# Patient Record
Sex: Female | Born: 1960 | Race: White | Hispanic: No | Marital: Married
Health system: Northeastern US, Academic
[De-identification: ages and names within clinical notes are randomized; demographics above are authoritative.]

---

## 2019-11-30 MED ORDER — DOXYCYCLINE HYCLATE 100 MG TABLET
100 | ORAL_TABLET | ORAL | 1 refills | 7.00000 days | Status: AC
Start: 2019-11-30 — End: 2019-12-07

## 2019-11-30 MED ORDER — FLUTICASONE 250 MCG-SALMETEROL 50 MCG/DOSE BLISTR POWDR FOR INHALATION
250-50 | RESPIRATORY_TRACT | 1 refills | 30.00000 days | Status: AC
Start: 2019-11-30 — End: 2019-12-28

## 2019-12-10 MED ORDER — BENZONATATE 200 MG CAPSULE
200 | ORAL_CAPSULE | ORAL | 1 refills | 7.00000 days | Status: AC
Start: 2019-12-10 — End: 2019-12-21

## 2019-12-10 MED ORDER — PREDNISONE 20 MG TABLET
20 | ORAL_TABLET | ORAL | 1 refills | 12.00000 days | Status: AC
Start: 2019-12-10 — End: 2019-12-21

## 2019-12-10 MED ORDER — ALBUTEROL SULFATE HFA 90 MCG/ACTUATION AEROSOL INHALER
90 | Freq: Four times a day (QID) | RESPIRATORY_TRACT | 2.00 refills | 25.00000 days | Status: AC | PRN
Start: 2019-12-10 — End: 2023-01-10

## 2019-12-28 MED ORDER — FLUTICASONE 250 MCG-SALMETEROL 50 MCG/DOSE BLISTR POWDR FOR INHALATION
250-50 | RESPIRATORY_TRACT | 1 refills | 30.00000 days | Status: AC
Start: 2019-12-28 — End: 2023-01-10

## 2019-12-31 ENCOUNTER — Telehealth: Admit: 2019-12-31 | Payer: PRIVATE HEALTH INSURANCE | Primary: Family Medicine

## 2019-12-31 NOTE — Telephone Encounter
Left message for patient regarding new patient visit. Notified them that if they expect any allergy testing to refrain from any antihistamines until after their appt. Pt asked to call back with questions or if unable to make the appt.

## 2020-01-01 ENCOUNTER — Encounter: Admit: 2020-01-01 | Payer: PRIVATE HEALTH INSURANCE | Attending: Hematology and Oncology | Primary: Family Medicine

## 2020-01-01 DIAGNOSIS — C20 Malignant neoplasm of rectum: Secondary | ICD-10-CM

## 2020-01-04 ENCOUNTER — Ambulatory Visit: Admit: 2020-01-04 | Payer: PRIVATE HEALTH INSURANCE | Attending: Allergy and Immunology | Primary: Family Medicine

## 2020-01-04 ENCOUNTER — Encounter: Admit: 2020-01-04 | Payer: PRIVATE HEALTH INSURANCE | Attending: Allergy and Immunology | Primary: Family Medicine

## 2020-01-04 DIAGNOSIS — C20 Malignant neoplasm of rectum: Secondary | ICD-10-CM

## 2020-01-04 DIAGNOSIS — R922 Inconclusive mammogram: Secondary | ICD-10-CM

## 2020-01-04 DIAGNOSIS — R053 Chronic cough: Secondary | ICD-10-CM

## 2020-01-04 DIAGNOSIS — N921 Excessive and frequent menstruation with irregular cycle: Secondary | ICD-10-CM

## 2020-01-04 DIAGNOSIS — M1990 Unspecified osteoarthritis, unspecified site: Secondary | ICD-10-CM

## 2020-01-04 DIAGNOSIS — J309 Allergic rhinitis, unspecified: Secondary | ICD-10-CM

## 2020-01-04 DIAGNOSIS — G43909 Migraine, unspecified, not intractable, without status migrainosus: Secondary | ICD-10-CM

## 2020-01-04 DIAGNOSIS — D219 Benign neoplasm of connective and other soft tissue, unspecified: Secondary | ICD-10-CM

## 2020-01-04 DIAGNOSIS — R05 Cough: Secondary | ICD-10-CM

## 2020-01-04 MED ORDER — MONTELUKAST 10 MG TABLET
10 mg | ORAL_TABLET | Freq: Every day | ORAL | 4 refills | Status: AC
Start: 2020-01-04 — End: 2020-04-11

## 2020-01-05 DIAGNOSIS — J45909 Unspecified asthma, uncomplicated: Secondary | ICD-10-CM

## 2020-01-07 ENCOUNTER — Telehealth
Admit: 2020-01-07 | Payer: PRIVATE HEALTH INSURANCE | Attending: Female Pelvic Medicine and Reconstructive Surgery | Primary: Family Medicine

## 2020-01-07 NOTE — Telephone Encounter
LVMM

## 2020-01-07 NOTE — Telephone Encounter
Called patient for pre-visit screening:Patient denies testing positive for COVID-19 within the last 2 weeks.Patient denies flu-like symptoms such as fever >100 degrees F, new cough, new shortness of breath, new sore throat, or sudden loss of taste/smell.Patient denies contact with a person known or suspected to have coronavirus.Patient denies living in SNF, STR, or ECF.Patient denies recent travel outside of [place] in the past 14 days.Patient understands there is a visitor restriction.

## 2020-01-08 ENCOUNTER — Inpatient Hospital Stay: Admit: 2020-01-08 | Discharge: 2020-01-08 | Payer: PRIVATE HEALTH INSURANCE | Primary: Family Medicine

## 2020-01-08 ENCOUNTER — Ambulatory Visit: Admit: 2020-01-08 | Payer: PRIVATE HEALTH INSURANCE | Attending: Hematology & Oncology | Primary: Family Medicine

## 2020-01-08 ENCOUNTER — Ambulatory Visit: Admit: 2020-01-08 | Payer: PRIVATE HEALTH INSURANCE | Primary: Family Medicine

## 2020-01-08 DIAGNOSIS — C20 Malignant neoplasm of rectum: Secondary | ICD-10-CM

## 2020-01-08 DIAGNOSIS — J309 Allergic rhinitis, unspecified: Secondary | ICD-10-CM

## 2020-01-08 LAB — COMPREHENSIVE METABOLIC PANEL
BKR A/G RATIO: 1.4 (ref 1.0–2.2)
BKR ALANINE AMINOTRANSFERASE (ALT): 18 U/L (ref 10–35)
BKR ALBUMIN: 4.2 g/dL (ref 3.6–4.9)
BKR ALKALINE PHOSPHATASE: 93 U/L (ref 9–122)
BKR ANION GAP: 10 (ref 7–17)
BKR ASPARTATE AMINOTRANSFERASE (AST): 23 U/L (ref 10–35)
BKR AST/ALT RATIO: 1.3 (ref 0.3–4.9)
BKR BILIRUBIN TOTAL: <0.2 mg/dL (ref <=1.2)
BKR BLOOD UREA NITROGEN: 12 mg/dL (ref 6–20)
BKR BUN / CREAT RATIO: 16.9 (ref 8.0–23.0)
BKR CALCIUM: 9.2 mg/dL (ref 8.8–10.2)
BKR CHLORIDE: 102 mmol/L (ref 98–107)
BKR CO2: 26 mmol/L (ref 20–30)
BKR CREATININE: 0.71 mg/dL (ref 0.40–1.30)
BKR EGFR (AFR AMER): >60 mL/min/{1.73_m2} (ref >60)
BKR EGFR (NON AFRICAN AMERICAN): >60 mL/min/{1.73_m2} (ref >60)
BKR GLOBULIN: 2.9 g/dL (ref 2.3–3.5)
BKR GLUCOSE: 94 mg/dL (ref 70–100)
BKR POTASSIUM: 4 mmol/L (ref 3.3–5.1)
BKR PROTEIN TOTAL: 7.1 g/dL (ref 6.6–8.7)
BKR SODIUM: 138 mmol/L (ref 136–144)

## 2020-01-08 LAB — CBC WITH AUTO DIFFERENTIAL
BKR WAM ABSOLUTE IMMATURE GRANULOCYTES: 0 x 1000/ÂµL (ref 0.0–0.3)
BKR WAM ABSOLUTE LYMPHOCYTE COUNT: 1.5 x 1000/ÂµL (ref 1.0–4.0)
BKR WAM ABSOLUTE NRBC: 0 x 1000/ÂµL (ref 0.0–0.0)
BKR WAM ANALYZER ANC: 3 x 1000/ÂµL (ref 1.0–11.0)
BKR WAM BASOPHIL ABSOLUTE COUNT: 0.1 x 1000/ÂµL — ABNORMAL HIGH (ref 0.0–0.0)
BKR WAM BASOPHILS: 1 % (ref 0.0–4.0)
BKR WAM EOSINOPHIL ABSOLUTE COUNT: 0.1 x 1000/ÂµL (ref 0.0–1.0)
BKR WAM EOSINOPHILS: 2.6 % (ref 0.0–7.0)
BKR WAM HEMATOCRIT: 39.3 % (ref 37.0–52.0)
BKR WAM HEMOGLOBIN: 12.6 g/dL (ref 12.0–18.0)
BKR WAM IMMATURE GRANULOCYTES: 0.2 % (ref 0.0–3.0)
BKR WAM LYMPHOCYTES: 29.4 % (ref 8.0–49.0)
BKR WAM MCH (PG): 29 pg (ref 27.0–31.0)
BKR WAM MCHC: 32.1 g/dL (ref 31.0–36.0)
BKR WAM MCV: 90.6 fL (ref 78.0–94.0)
BKR WAM MONOCYTE ABSOLUTE COUNT: 0.3 x 1000/ÂµL (ref 0.0–2.0)
BKR WAM MONOCYTES: 6.5 % (ref 4.0–15.0)
BKR WAM MPV: 9.3 fL (ref 6.0–11.0)
BKR WAM NEUTROPHILS: 60.3 % (ref 37.0–84.0)
BKR WAM NUCLEATED RED BLOOD CELLS: 0 % (ref 0.0–1.0)
BKR WAM PLATELETS: 326 x1000/ÂµL (ref 140–440)
BKR WAM RDW-CV: 13.5 % (ref 11.5–14.5)
BKR WAM RED BLOOD CELL COUNT: 4.3 M/ÂµL (ref 3.8–5.9)
BKR WAM WHITE BLOOD CELL COUNT: 4.9 x1000/ÂµL (ref 4.0–10.0)

## 2020-01-08 LAB — CEA: BKR CARCINOEMBRYONIC ANTIGEN (YH ROCHE): <1.8 ng/mL
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
## 2020-01-11 LAB — ALLERGEN, RESPIRATORY PANEL (BH GH LMW YH)
BKR ALLERGEN ALTERNARIA ALTERNARA (TENUIS) IGE CONC: <0.1 kU/L (ref <0.10)
BKR ALLERGEN ASPERGILLUS FUMIGATUS IGE CONC: <0.1 kU/L (ref <0.10)
BKR ALLERGEN BERMUDA GRASS IGE CONC: <0.1 kU/L (ref <0.10)
BKR ALLERGEN CAT DANDER IGE CONC: 0.16 kU/L — ABNORMAL HIGH (ref <0.10)
BKR ALLERGEN CEDAR MOUNTAIN IGE CONC: <0.1 kU/L (ref <0.10)
BKR ALLERGEN CLADOSPORIUM HERBARUM IGE CONC: <0.1 kU/L (ref <0.10)
BKR ALLERGEN COMMON (SHORT) RAGWEED IGE CONC: 0.28 kU/L — ABNORMAL HIGH (ref <0.10)
BKR ALLERGEN COTTONWOOD IGE CONC: <0.1 kU/L (ref <0.10)
BKR ALLERGEN D. FARINAE IGE CONC: 1.87 kU/L — ABNORMAL HIGH (ref <0.10)
BKR ALLERGEN D. PTERONYSSINUS IGE CONC: 2.06 kU/L — ABNORMAL HIGH (ref <0.10)
BKR ALLERGEN DOG DANDER IGE CONC: <0.1 kU/L (ref <0.10)
BKR ALLERGEN ELM IGE CONC: <0.1 kU/L (ref <0.10)
BKR ALLERGEN GERMAN COCKROACH IGE CONC: <0.1 kU/L (ref <0.10)
BKR ALLERGEN MAPLE (BOX ELDER) IGE CONC: <0.1 kU/L (ref <0.10)
BKR ALLERGEN MUGWORT IGE CONC: <0.1 kU/L (ref <0.10)
BKR ALLERGEN OAK IGE CONC: 1.01 kU/L — ABNORMAL HIGH (ref <0.10)
BKR ALLERGEN PENICILLIUM CHRYSOGENUM/NOTATUM IGE CONC: <0.1 kU/L (ref <0.10)
BKR ALLERGEN ROUGH PIGWEED IGE CONC: <0.1 kU/L (ref <0.10)
BKR ALLERGEN SHEEP SORREL IGE CONC: <0.1 kU/L (ref <0.10)
BKR ALLERGEN SILVER BIRCH IGE CONC: 3.56 kU/L — ABNORMAL HIGH (ref <0.10)
BKR ALLERGEN SYCAMORE IGE CONC: <0.1 kU/L (ref <0.10)
BKR ALLERGEN TIMOTHY GRASS IGE CONC: <0.1 kU/L (ref <0.10)
BKR ALLERGEN WALNUT TREE IGE CONC: <0.1 kU/L (ref <0.10)
BKR ALLERGEN WHITE ASH IGE CONC: <0.1 kU/L (ref <0.10)
BKR ALLERGEN WHITE MULBERRY IGE CONC: <0.1 kU/L (ref <0.10)
BKR ALLERGEN, MOUSE URINE PROTEINS IGE CONC: <0.1 kU/L (ref <0.10)
BKR IGE TOTAL: 19 kU/L (ref <=214)
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
## 2020-01-21 ENCOUNTER — Encounter: Admit: 2020-01-21 | Payer: PRIVATE HEALTH INSURANCE | Primary: Family Medicine

## 2020-01-21 DIAGNOSIS — J45909 Unspecified asthma, uncomplicated: Secondary | ICD-10-CM

## 2020-01-22 ENCOUNTER — Telehealth: Admit: 2020-01-22 | Payer: PRIVATE HEALTH INSURANCE | Attending: Allergy and Immunology | Primary: Family Medicine

## 2020-01-22 NOTE — Telephone Encounter
Pt. Called to inform she needs to have orders resent to the lab as the previous one was incorrect.  Please call her she is unable to make an appt. Unless they have been resent correctly.

## 2020-01-24 ENCOUNTER — Encounter: Admit: 2020-01-24 | Payer: PRIVATE HEALTH INSURANCE | Attending: Allergy and Immunology | Primary: Family Medicine

## 2020-01-28 NOTE — Other
Testing showed possible triggers of Dust mites and some tree pollens. Cat dander and Ragweed results are equivocal.

## 2020-01-31 ENCOUNTER — Ambulatory Visit: Admit: 2020-01-31 | Payer: PRIVATE HEALTH INSURANCE | Primary: Family Medicine

## 2020-01-31 DIAGNOSIS — Z23 Encounter for immunization: Secondary | ICD-10-CM

## 2020-02-04 ENCOUNTER — Encounter: Admit: 2020-02-04 | Payer: PRIVATE HEALTH INSURANCE | Primary: Family Medicine

## 2020-02-04 DIAGNOSIS — Z01818 Encounter for other preprocedural examination: Secondary | ICD-10-CM

## 2020-02-05 ENCOUNTER — Inpatient Hospital Stay: Admit: 2020-02-05 | Discharge: 2020-02-05 | Payer: PRIVATE HEALTH INSURANCE | Primary: Family Medicine

## 2020-02-05 DIAGNOSIS — Z01818 Encounter for other preprocedural examination: Secondary | ICD-10-CM

## 2020-02-05 DIAGNOSIS — Z20822 Contact with and (suspected) exposure to covid-19: Secondary | ICD-10-CM

## 2020-02-06 DIAGNOSIS — Z01812 Encounter for preprocedural laboratory examination: Secondary | ICD-10-CM

## 2020-02-06 LAB — COVID-19 CLEARANCE OR FOR PLACEMENT ONLY: BKR SARS-COV-2 RNA (COVID-19) (YH): NOT DETECTED

## 2020-02-08 ENCOUNTER — Inpatient Hospital Stay: Admit: 2020-02-08 | Discharge: 2020-02-08 | Payer: PRIVATE HEALTH INSURANCE | Primary: Family Medicine

## 2020-02-08 DIAGNOSIS — J45909 Unspecified asthma, uncomplicated: Secondary | ICD-10-CM

## 2020-02-08 MED ORDER — ALBUTEROL SULFATE 2.5 MG/3 ML (0.083 %) SOLUTION FOR NEBULIZATION
2.5 mg /3 mL (0.083 %) | Freq: Once | RESPIRATORY_TRACT | Status: CP
Start: 2020-02-08 — End: ?
  Administered 2020-02-08: 16:00:00 2.5 mL via RESPIRATORY_TRACT

## 2020-02-21 ENCOUNTER — Ambulatory Visit: Admit: 2020-02-21 | Payer: PRIVATE HEALTH INSURANCE | Primary: Family Medicine

## 2020-02-21 DIAGNOSIS — Z23 Encounter for immunization: Secondary | ICD-10-CM

## 2020-03-15 MED ORDER — NORTRIPTYLINE 10 MG CAPSULE
10 | ORAL_CAPSULE | ORAL | 1 refills | 90.00000 days | Status: AC
Start: 2020-03-15 — End: 2020-04-11

## 2020-03-22 ENCOUNTER — Telehealth
Admit: 2020-03-22 | Payer: PRIVATE HEALTH INSURANCE | Attending: Student in an Organized Health Care Education/Training Program | Primary: Family Medicine

## 2020-03-22 NOTE — Telephone Encounter
I called the patient back, she called our nurses who passed me a message:Patient is having severe allergy reaction with swelling eyes. Patient has been taking allegra q 6 hrs and Pataday frequently. She has appointment with MD next week . However, she can not wait for that time. Please call patient: [phone] states for the last day the allergies have been worse. Asthma has been improved.Eyes swollen. Itching all over her face/ears. No specific allergy exposure. Has been taking allerga every 6 hours. Today has taken 2 today so far. We can increase her antihistamine treatment - She can take Zyrtec BID (max 2 pills AM and 2 pills in PM), and change to Pataday 0.7% eye drops daily. Discussed that hopefully this increased regimen will help. She can try this for two days. If her allergies are worsening, discussed she can reach out to [place] in two days to let us know and we can discuss other possibilities. If she has an acute worsening of symptoms I advised her to seek urgent care in the meantime. I notified attending, Dr. Christin Fudge.Sol Passer, MDAllergy and Immunology Fellow

## 2020-03-24 ENCOUNTER — Encounter: Admit: 2020-03-24 | Payer: PRIVATE HEALTH INSURANCE | Attending: Allergy and Immunology | Primary: Family Medicine

## 2020-03-28 ENCOUNTER — Encounter: Admit: 2020-03-28 | Payer: PRIVATE HEALTH INSURANCE | Attending: Allergy and Immunology | Primary: Family Medicine

## 2020-03-28 ENCOUNTER — Ambulatory Visit: Admit: 2020-03-28 | Payer: PRIVATE HEALTH INSURANCE | Attending: Allergy and Immunology | Primary: Family Medicine

## 2020-03-28 DIAGNOSIS — J309 Allergic rhinitis, unspecified: Secondary | ICD-10-CM

## 2020-03-28 DIAGNOSIS — H1010 Acute atopic conjunctivitis, unspecified eye: Secondary | ICD-10-CM

## 2020-03-28 DIAGNOSIS — C20 Malignant neoplasm of rectum: Secondary | ICD-10-CM

## 2020-03-28 DIAGNOSIS — D219 Benign neoplasm of connective and other soft tissue, unspecified: Secondary | ICD-10-CM

## 2020-03-28 DIAGNOSIS — R922 Inconclusive mammogram: Secondary | ICD-10-CM

## 2020-03-28 DIAGNOSIS — G43909 Migraine, unspecified, not intractable, without status migrainosus: Secondary | ICD-10-CM

## 2020-03-28 DIAGNOSIS — N921 Excessive and frequent menstruation with irregular cycle: Secondary | ICD-10-CM

## 2020-03-28 DIAGNOSIS — M1990 Unspecified osteoarthritis, unspecified site: Secondary | ICD-10-CM

## 2020-03-28 MED ORDER — PATADAY ONCE DAILY RELIEF 0.7 % EYE DROPS
0.7 % | Status: AC
Start: 2020-03-28 — End: ?

## 2020-03-28 MED ORDER — AZELASTINE 137 MCG-FLUTICASONE 50 MCG/SPRAY NASAL SPRAY
137-50 mcg/spray | Freq: Two times a day (BID) | NASAL | 3 refills | Status: AC
Start: 2020-03-28 — End: 2020-06-07

## 2020-03-28 MED ORDER — ZYRTEC 10 MG CAPSULE
10 mg | Status: AC
Start: 2020-03-28 — End: ?

## 2020-03-28 MED ORDER — FISH OIL 120 MG-180 MG-500 MG CAPSULE
120-180-500 | ORAL | 0.00 refills | 90.00000 days | Status: AC
Start: 2020-03-28 — End: 2022-04-27

## 2020-03-28 MED ORDER — PROBIOTIC 3 BILLION CELL CAPSULE
3 | ORAL | 0.00 refills | 69.50000 days | Status: AC
Start: 2020-03-28 — End: 2023-01-10
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
## 2020-03-28 NOTE — Patient Instructions
Acute exacerbation of seasonal allergic rhino conjunctivitis.1. Continue Pazeo daily.2. Start Dymista 1 spray each nostril twice daily, if this is not covered by your insurance try Sensimist and give our office a call and we will prescribe azelastine as a separate nasal spray.3. With the dryness you are experiencing, decreased Zyrtec (cetirizine) 10 mg to one tablet twice a day.4. If in a week if you are not markedly better call our office and will prescribe short steroid course but this is not preferred over maintenance with allergy medications.Pollen avoidance measures include, minimizing outdoor exposure before 10am, keeping windows closed in your home and in the car. When in your vehicle put the air on recycle. Remove any hats, jackets, shoes, or other outerwear when returning home. Wash your hair and shower before bed.

## 2020-03-29 ENCOUNTER — Encounter: Admit: 2020-03-29 | Payer: PRIVATE HEALTH INSURANCE | Attending: Allergy and Immunology | Primary: Family Medicine

## 2020-03-30 ENCOUNTER — Encounter: Admit: 2020-03-30 | Payer: PRIVATE HEALTH INSURANCE | Attending: Allergy and Immunology | Primary: Family Medicine

## 2020-03-31 ENCOUNTER — Encounter: Admit: 2020-03-31 | Payer: PRIVATE HEALTH INSURANCE | Attending: Allergy and Immunology | Primary: Family Medicine

## 2020-04-08 ENCOUNTER — Telehealth: Admit: 2020-04-08 | Payer: PRIVATE HEALTH INSURANCE | Primary: Family Medicine

## 2020-04-08 ENCOUNTER — Encounter: Admit: 2020-04-08 | Payer: PRIVATE HEALTH INSURANCE | Primary: Family Medicine

## 2020-04-08 ENCOUNTER — Encounter
Admit: 2020-04-08 | Payer: PRIVATE HEALTH INSURANCE | Attending: Student in an Organized Health Care Education/Training Program | Primary: Family Medicine

## 2020-04-08 NOTE — Progress Notes
Per note 01/04/20 patient is intolerant of flonase only formulation due to burning. See that note for details. Since she is intolerant of nasal ICS along, we would like to try combination fluticasone-azelastine formula.

## 2020-04-08 NOTE — Telephone Encounter
Call from pt. Still waiting on appeal for Dymista. Need documentation of failure of both azelastine and fluticasone or a different script sent.

## 2020-04-11 ENCOUNTER — Encounter: Admit: 2020-04-11 | Payer: PRIVATE HEALTH INSURANCE | Primary: Family Medicine

## 2020-04-11 MED ORDER — LEVOCETIRIZINE 5 MG TABLET
5 | Freq: Every evening | ORAL | 2.00 refills | 90.00000 days | Status: AC
Start: 2020-04-11 — End: 2023-01-10

## 2020-04-11 MED ORDER — NORTRIPTYLINE 10 MG CAPSULE
10 | ORAL_CAPSULE | ORAL | 2 refills | 90.00000 days | Status: AC
Start: 2020-04-11 — End: 2020-06-06

## 2020-04-11 MED ORDER — MONTELUKAST 10 MG TABLET
10 mg | ORAL_TABLET | Freq: Every day | ORAL | 4 refills | Status: AC
Start: 2020-04-11 — End: 2021-01-02

## 2020-04-19 ENCOUNTER — Encounter: Admit: 2020-04-19 | Payer: PRIVATE HEALTH INSURANCE | Attending: Family | Primary: Family Medicine

## 2020-04-20 MED ORDER — ESTRADIOL 2 MG TABLET
2 mg | ORAL_TABLET | 1 refills | Status: AC
Start: 2020-04-20 — End: 2020-08-24

## 2020-05-04 ENCOUNTER — Encounter: Admit: 2020-05-04 | Payer: PRIVATE HEALTH INSURANCE | Attending: Hematology and Oncology | Primary: Family Medicine

## 2020-05-04 ENCOUNTER — Telehealth: Admit: 2020-05-04 | Payer: PRIVATE HEALTH INSURANCE | Primary: Family Medicine

## 2020-05-04 DIAGNOSIS — C20 Malignant neoplasm of rectum: Secondary | ICD-10-CM

## 2020-05-04 NOTE — Telephone Encounter
Requesting refill for Singulair

## 2020-05-04 NOTE — Telephone Encounter
Called and left patient message that she has 3 refills for medication and advised to call her pharmacy.

## 2020-05-09 ENCOUNTER — Telehealth: Admit: 2020-05-09 | Payer: PRIVATE HEALTH INSURANCE | Primary: Family Medicine

## 2020-05-09 NOTE — Telephone Encounter
Returned patient call and left message. Message patient that [facility] was called and provided them with Provider's DEA # to resolve issue of provider's  inactive license number. Per pharmacy refill went through with [facility] #.

## 2020-05-10 ENCOUNTER — Inpatient Hospital Stay: Admit: 2020-05-10 | Discharge: 2020-05-10 | Payer: PRIVATE HEALTH INSURANCE | Primary: Family Medicine

## 2020-05-10 DIAGNOSIS — C189 Malignant neoplasm of colon, unspecified: Secondary | ICD-10-CM

## 2020-05-10 DIAGNOSIS — C20 Malignant neoplasm of rectum: Secondary | ICD-10-CM

## 2020-05-10 MED ORDER — SODIUM CHLORIDE 0.9 % BOLUS (NEW BAG)
0.9 % | Freq: Once | INTRAVENOUS | Status: CP
Start: 2020-05-10 — End: ?
  Administered 2020-05-10: 12:00:00 0.9 mL/h via INTRAVENOUS

## 2020-05-10 MED ORDER — IOHEXOL 350 MG IODINE/ML INTRAVENOUS SOLUTION
350 mg iodine/mL | Freq: Once | INTRAVENOUS | Status: CP | PRN
Start: 2020-05-10 — End: ?
  Administered 2020-05-10: 12:00:00 350 mL via INTRAVENOUS

## 2020-05-13 ENCOUNTER — Ambulatory Visit: Admit: 2020-05-13 | Payer: PRIVATE HEALTH INSURANCE | Attending: Hematology & Oncology | Primary: Family Medicine

## 2020-05-13 ENCOUNTER — Inpatient Hospital Stay: Admit: 2020-05-13 | Discharge: 2020-05-13 | Payer: PRIVATE HEALTH INSURANCE | Primary: Family Medicine

## 2020-05-13 DIAGNOSIS — C20 Malignant neoplasm of rectum: Secondary | ICD-10-CM

## 2020-05-16 NOTE — Progress Notes
Left without being seenLMOM on 6/18 and again on 6/21 to call for results

## 2020-06-06 ENCOUNTER — Telehealth: Admit: 2020-06-06 | Payer: PRIVATE HEALTH INSURANCE | Primary: Family Medicine

## 2020-06-06 MED ORDER — NORTRIPTYLINE 10 MG CAPSULE
10 | ORAL_CAPSULE | ORAL | 2 refills | 90.00000 days | Status: AC
Start: 2020-06-06 — End: 2020-08-30

## 2020-06-06 NOTE — Telephone Encounter
Received a note from [facility] stating that azelastin- flutic 137-50 mg SPR is not covered by patients insurance. Alterative is requested.  TY

## 2020-06-07 ENCOUNTER — Encounter: Admit: 2020-06-07 | Payer: PRIVATE HEALTH INSURANCE | Primary: Family Medicine

## 2020-06-07 MED ORDER — AZELASTINE 137 MCG (0.1 %) NASAL SPRAY AEROSOL
137 mcg (0.1 %) | Freq: Two times a day (BID) | NASAL | 13 refills | Status: AC
Start: 2020-06-07 — End: ?

## 2020-06-08 ENCOUNTER — Encounter
Admit: 2020-06-08 | Payer: PRIVATE HEALTH INSURANCE | Attending: Student in an Organized Health Care Education/Training Program | Primary: Family Medicine

## 2020-06-08 DIAGNOSIS — J309 Allergic rhinitis, unspecified: Secondary | ICD-10-CM

## 2020-06-08 MED ORDER — CYCLOBENZAPRINE 10 MG TABLET
10 | ORAL_TABLET | Freq: Three times a day (TID) | ORAL | 2 refills | 10.00000 days | Status: AC | PRN
Start: 2020-06-08 — End: 2020-06-08

## 2020-06-08 MED ORDER — CYCLOBENZAPRINE 5 MG TABLET
5 | ORAL_TABLET | ORAL | 1 refills | 10.00000 days | Status: AC
Start: 2020-06-08 — End: 2022-09-20

## 2020-06-08 MED ORDER — BUDESONIDE 32 MCG/ACTUATION NASAL SPRAY
32 mcg/actuation | Freq: Every day | NASAL | 13 refills | Status: AC
Start: 2020-06-08 — End: ?

## 2020-06-09 MED ORDER — AMOXICILLIN 875 MG-POTASSIUM CLAVULANATE 125 MG TABLET
875-125 | ORAL_TABLET | ORAL | 1 refills | 10.00000 days | Status: AC
Start: 2020-06-09 — End: 2020-09-14

## 2020-06-27 ENCOUNTER — Encounter: Admit: 2020-06-27 | Payer: PRIVATE HEALTH INSURANCE | Attending: Allergy and Immunology | Primary: Family Medicine

## 2020-06-27 ENCOUNTER — Ambulatory Visit: Admit: 2020-06-27 | Payer: PRIVATE HEALTH INSURANCE | Attending: Allergy and Immunology | Primary: Family Medicine

## 2020-06-27 DIAGNOSIS — D219 Benign neoplasm of connective and other soft tissue, unspecified: Secondary | ICD-10-CM

## 2020-06-27 DIAGNOSIS — J309 Allergic rhinitis, unspecified: Secondary | ICD-10-CM

## 2020-06-27 DIAGNOSIS — M1990 Unspecified osteoarthritis, unspecified site: Secondary | ICD-10-CM

## 2020-06-27 DIAGNOSIS — R922 Inconclusive mammogram: Secondary | ICD-10-CM

## 2020-06-27 DIAGNOSIS — C20 Malignant neoplasm of rectum: Secondary | ICD-10-CM

## 2020-06-27 DIAGNOSIS — G43909 Migraine, unspecified, not intractable, without status migrainosus: Secondary | ICD-10-CM

## 2020-06-27 DIAGNOSIS — N921 Excessive and frequent menstruation with irregular cycle: Secondary | ICD-10-CM
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
## 2020-06-27 NOTE — Patient Instructions
When you feels symtpoms during ragweed season, use singulair, zyrtec, and nasal spray.Follow up in November for consideration of allergy shots. If you need albuterol more than twice a week, give [place] a call.

## 2020-08-18 ENCOUNTER — Encounter: Admit: 2020-08-18 | Payer: PRIVATE HEALTH INSURANCE | Attending: Family | Primary: Family Medicine

## 2020-08-24 MED ORDER — ESTRADIOL 2 MG TABLET
2 mg | ORAL_TABLET | 1 refills | Status: AC
Start: 2020-08-24 — End: 2020-10-06

## 2020-08-30 MED ORDER — NORTRIPTYLINE 10 MG CAPSULE
10 | ORAL_CAPSULE | ORAL | 2 refills | 90.00000 days | Status: AC
Start: 2020-08-30 — End: 2020-11-04

## 2020-09-22 ENCOUNTER — Telehealth: Admit: 2020-09-22 | Payer: PRIVATE HEALTH INSURANCE | Primary: Family Medicine

## 2020-09-22 NOTE — Telephone Encounter
Left message to confirm apt for testing. Instructed not to take any antihistamines from now until after test is complete.  Please let us know  if you are unable to keep this appointment. Instructed to call our office for any questions or concerns.

## 2020-09-27 ENCOUNTER — Inpatient Hospital Stay: Admit: 2020-09-27 | Discharge: 2020-09-27 | Payer: PRIVATE HEALTH INSURANCE | Primary: Family Medicine

## 2020-09-27 ENCOUNTER — Encounter: Admit: 2020-09-27 | Payer: PRIVATE HEALTH INSURANCE | Attending: Obstetrics and Gynecology | Primary: Family Medicine

## 2020-09-27 DIAGNOSIS — R922 Inconclusive mammogram: Secondary | ICD-10-CM

## 2020-09-27 DIAGNOSIS — Z1231 Encounter for screening mammogram for malignant neoplasm of breast: Secondary | ICD-10-CM

## 2020-09-27 DIAGNOSIS — Z0000 Encounter for general adult medical examination without abnormal findings: Secondary | ICD-10-CM

## 2020-09-27 DIAGNOSIS — R413 Other amnesia: Secondary | ICD-10-CM

## 2020-09-27 LAB — COMPREHENSIVE METABOLIC PANEL
BKR A/G RATIO: 2 (ref 1.0–2.2)
BKR ALANINE AMINOTRANSFERASE (ALT): 15 U/L (ref 10–35)
BKR ALBUMIN: 4.4 g/dL (ref 3.6–4.9)
BKR ALKALINE PHOSPHATASE: 80 U/L (ref 9–122)
BKR ASPARTATE AMINOTRANSFERASE (AST): 20 U/L (ref 10–35)
BKR AST/ALT RATIO: 1.3
BKR BILIRUBIN TOTAL: <0.2 mg/dL (ref <=1.2)
BKR BLOOD UREA NITROGEN: 17 mg/dL (ref 6–20)
BKR BUN / CREAT RATIO: 20.2 (ref 8.0–23.0)
BKR CALCIUM: 9.4 mg/dL (ref 8.8–10.2)
BKR CHLORIDE: 102 mmol/L (ref 98–107)
BKR CO2: 28 mmol/L (ref 20–30)
BKR CREATININE: 0.84 mg/dL — ABNORMAL HIGH (ref 0.40–1.30)
BKR EGFR (AFR AMER): >60 mL/min/1.73m2 — ABNORMAL HIGH (ref >60)
BKR EGFR (NON AFRICAN AMERICAN): >60 mL/min/{1.73_m2} (ref >60)
BKR GLOBULIN: 2.2 g/dL — ABNORMAL LOW (ref 2.3–3.5)
BKR POTASSIUM: 4.3 mmol/L (ref 3.3–5.3)
BKR PROTEIN TOTAL: 6.6 g/dL (ref 6.6–8.7)
BKR SODIUM: 139 mmol/L (ref 136–144)
BKR WAM IMMATURE GRANULOCYTES: <0.2 mg/dL (ref <=1.2)

## 2020-09-27 LAB — CBC WITH AUTO DIFFERENTIAL
BKR WAM ABSOLUTE IMMATURE GRANULOCYTES: 0 x 1000/??L (ref 0.0–0.3)
BKR WAM ABSOLUTE LYMPHOCYTE COUNT: 1.9 x 1000/ÂµL (ref 1.0–4.0)
BKR WAM ABSOLUTE NRBC: 0 x 1000/ÂµL (ref 0.0–0.0)
BKR WAM ANALYZER ANC: 4.3 x 1000/??L (ref 1.0–11.0)
BKR WAM BASOPHIL ABSOLUTE COUNT: 0.1 x 1000/??L — ABNORMAL HIGH (ref 0.0–0.0)
BKR WAM BASOPHILS: 0.9 % (ref 0.0–4.0)
BKR WAM EOSINOPHIL ABSOLUTE COUNT: 0.1 x 1000/??L (ref 0.0–1.0)
BKR WAM EOSINOPHILS: 1.5 % (ref 0.0–7.0)
BKR WAM HEMATOCRIT: 37.8 % (ref 37.0–52.0)
BKR WAM LYMPHOCYTES: 27.8 % (ref 8.0–49.0)
BKR WAM MCH (PG): 29.5 pg (ref 27.0–31.0)
BKR WAM MCV: 90.6 fL (ref 78.0–94.0)
BKR WAM MONOCYTE ABSOLUTE COUNT: 0.4 x 1000/ÂµL (ref 0.0–2.0)
BKR WAM MONOCYTES: 6.2 % (ref 4.0–15.0)
BKR WAM MPV: 10.1 fL (ref 6.0–11.0)
BKR WAM NEUTROPHILS: 63.3 % (ref 37.0–84.0)
BKR WAM NUCLEATED RED BLOOD CELLS: 0 % (ref 0.0–1.0)
BKR WAM PLATELETS: 350 x1000/ÂµL (ref 140–440)
BKR WAM RDW-CV: 13.5 % (ref 11.5–14.5)
BKR WAM RED BLOOD CELL COUNT: 4.2 M/ÂµL (ref 3.8–5.9)
BKR WAM WHITE BLOOD CELL COUNT: 6.7 x1000/ÂµL (ref 4.0–10.0)

## 2020-09-27 LAB — TSH W/REFLEX TO FT4     (BH GH LMW Q YH): BKR THYROID STIMULATING HORMONE: 2.85 ??IU/mL

## 2020-09-27 LAB — LIPID PANEL
BKR CHOLESTEROL/HDL RATIO: 1.8 (ref 0.0–5.0)
BKR CHOLESTEROL: 216 mg/dL — ABNORMAL HIGH
BKR HDL CHOLESTEROL: 117 mg/dL (ref >=40)
BKR LDL CHOLESTEROL CALCULATED: 77 mg/dL
BKR TRIGLYCERIDES: 111 mg/dL

## 2020-09-27 LAB — VITAMIN B12: BKR VITAMIN B12: 1602 pg/mL — ABNORMAL HIGH (ref 232–1245)

## 2020-09-30 ENCOUNTER — Ambulatory Visit: Admit: 2020-09-30 | Payer: PRIVATE HEALTH INSURANCE | Primary: Family Medicine

## 2020-09-30 ENCOUNTER — Ambulatory Visit: Admit: 2020-09-30 | Payer: PRIVATE HEALTH INSURANCE | Attending: Allergy and Immunology | Primary: Family Medicine

## 2020-09-30 NOTE — Other
Labs normal B12 supra normal Proceed as planned Call if any concerns Left verbatim message for patient regarding lab results and AMW message

## 2020-09-30 NOTE — Other
Labs normal B12 supra normalProceed as plannedCall if any concerns

## 2020-10-06 ENCOUNTER — Encounter: Admit: 2020-10-06 | Payer: PRIVATE HEALTH INSURANCE | Attending: Obstetrics and Gynecology | Primary: Family Medicine

## 2020-10-06 ENCOUNTER — Ambulatory Visit: Admit: 2020-10-06 | Payer: PRIVATE HEALTH INSURANCE | Attending: Obstetrics and Gynecology | Primary: Family Medicine

## 2020-10-06 DIAGNOSIS — R922 Inconclusive mammogram: Secondary | ICD-10-CM

## 2020-10-06 DIAGNOSIS — N921 Excessive and frequent menstruation with irregular cycle: Secondary | ICD-10-CM

## 2020-10-06 DIAGNOSIS — G43909 Migraine, unspecified, not intractable, without status migrainosus: Secondary | ICD-10-CM

## 2020-10-06 DIAGNOSIS — Z01419 Encounter for gynecological examination (general) (routine) without abnormal findings: Secondary | ICD-10-CM

## 2020-10-06 DIAGNOSIS — M1990 Unspecified osteoarthritis, unspecified site: Secondary | ICD-10-CM

## 2020-10-06 DIAGNOSIS — C20 Malignant neoplasm of rectum: Secondary | ICD-10-CM

## 2020-10-06 DIAGNOSIS — Z803 Family history of malignant neoplasm of breast: Secondary | ICD-10-CM

## 2020-10-06 DIAGNOSIS — Z90711 Acquired absence of uterus with remaining cervical stump: Secondary | ICD-10-CM

## 2020-10-06 DIAGNOSIS — Z1231 Encounter for screening mammogram for malignant neoplasm of breast: Secondary | ICD-10-CM

## 2020-10-06 DIAGNOSIS — K769 Liver disease, unspecified: Secondary | ICD-10-CM

## 2020-10-06 DIAGNOSIS — D219 Benign neoplasm of connective and other soft tissue, unspecified: Secondary | ICD-10-CM

## 2020-10-06 MED ORDER — ESTRADIOL 2 MG TABLET
2 mg | ORAL_TABLET | 4 refills | Status: AC
Start: 2020-10-06 — End: 2021-07-27

## 2020-10-06 NOTE — Progress Notes
Subjective:   Emily Townsend is a 59 y.o. female  who had concerns including Gynecologic Exam.HPIPatient's medications, allergies, problem list, past medical, surgical, social and family histories were reviewed and updated as appropriate.Review of SystemsS/p supracervical hyst on estradiol 2 no issues; colon ca survivor  Objective:  BP 124/74  - Ht 4' 10.75 (1.492 m)  - Wt 50.8 kg  - BMI 22.81 kg/m? There were no orthostatic vitals filed for this visit.Physical ExamBreasts negative no dominant masses no axillary adenopathy. Importance of self breast exams stressed.Pelvic -External genitalia-normal, no lesions, Bartholin and Skene's ducts normal; urethra-normal size, location, no lesions, or prolapse; bladder-no masses, tenderness, normal support; vagina-clear, normal estrogen effect, no lesions, discharge, no cystocele, or rectocele; cervix-clear, no lesions, normal support;  Assessment / Plan:  No pap this yr mammo and u/s cont estradiol 2 may try 1 mg in spring Electronically Signed by Gillermina Hu, MD, October 06, 2020

## 2020-11-04 ENCOUNTER — Telehealth: Admit: 2020-11-04 | Payer: PRIVATE HEALTH INSURANCE | Attending: Allergy and Immunology | Primary: Family Medicine

## 2020-11-04 MED ORDER — NORTRIPTYLINE 10 MG CAPSULE
10 | ORAL_CAPSULE | ORAL | 2 refills | 90.00000 days | Status: AC
Start: 2020-11-04 — End: 2021-02-01

## 2020-11-04 NOTE — Telephone Encounter
Patient called to confirm appointment. Reminded patient not to take antihistamines from now until test is complete. Patient verbalized understanding. Patient instructed to call our office if any questions occur, or if they plan on canceling their appt.

## 2020-11-10 ENCOUNTER — Inpatient Hospital Stay: Admit: 2020-11-10 | Discharge: 2020-11-10 | Payer: PRIVATE HEALTH INSURANCE | Primary: Family Medicine

## 2020-11-10 DIAGNOSIS — U071 COVID-19: Secondary | ICD-10-CM

## 2020-11-10 DIAGNOSIS — Z20828 Contact with and (suspected) exposure to other viral communicable diseases: Secondary | ICD-10-CM

## 2020-11-11 ENCOUNTER — Ambulatory Visit: Admit: 2020-11-11 | Payer: PRIVATE HEALTH INSURANCE | Attending: Allergy and Immunology | Primary: Family Medicine

## 2020-11-11 ENCOUNTER — Ambulatory Visit: Admit: 2020-11-11 | Payer: PRIVATE HEALTH INSURANCE | Primary: Family Medicine

## 2020-11-11 LAB — SARS COV-2 (COVID-19) RNA-~~LOC~~ LABS (BH GH LMW YH): BKR SARS-COV-2 RNA (COVID-19) (YH): POSITIVE — AB

## 2020-11-12 ENCOUNTER — Encounter: Admit: 2020-11-12 | Payer: PRIVATE HEALTH INSURANCE | Primary: Family Medicine

## 2020-11-12 NOTE — Other
Patient mychart message sent to follow-up on positive result.

## 2020-11-18 ENCOUNTER — Inpatient Hospital Stay: Admit: 2020-11-18 | Discharge: 2020-11-18 | Payer: PRIVATE HEALTH INSURANCE | Primary: Family Medicine

## 2020-11-18 DIAGNOSIS — U071 COVID-19: Secondary | ICD-10-CM

## 2020-11-18 DIAGNOSIS — Z20828 Contact with and (suspected) exposure to other viral communicable diseases: Secondary | ICD-10-CM

## 2020-11-21 ENCOUNTER — Encounter: Admit: 2020-11-21 | Payer: PRIVATE HEALTH INSURANCE | Primary: Family Medicine

## 2020-11-21 LAB — SARS COV-2 (COVID-19) RNA-~~LOC~~ LABS (BH GH LMW YH): BKR SARS-COV-2 RNA (COVID-19) (YH): DETECTED — AB

## 2020-11-21 NOTE — Other
Patient mychart message sent to follow-up on positive result.

## 2020-12-02 ENCOUNTER — Telehealth: Admit: 2020-12-02 | Payer: PRIVATE HEALTH INSURANCE | Primary: Family Medicine

## 2020-12-02 NOTE — Telephone Encounter
Called patient to check in due to recently testing positive for covid. Patient states she has been slowly improving and denies concerns at this time.

## 2021-01-02 ENCOUNTER — Encounter: Admit: 2021-01-02 | Payer: PRIVATE HEALTH INSURANCE | Primary: Family Medicine

## 2021-01-02 MED ORDER — MONTELUKAST 10 MG TABLET
10 mg | ORAL_TABLET | Freq: Every day | ORAL | 4 refills | Status: AC
Start: 2021-01-02 — End: 2021-05-30

## 2021-01-04 ENCOUNTER — Inpatient Hospital Stay: Admit: 2021-01-04 | Discharge: 2021-01-04 | Payer: PRIVATE HEALTH INSURANCE | Primary: Family Medicine

## 2021-01-04 DIAGNOSIS — R922 Inconclusive mammogram: Secondary | ICD-10-CM

## 2021-01-04 DIAGNOSIS — Z1231 Encounter for screening mammogram for malignant neoplasm of breast: Secondary | ICD-10-CM

## 2021-02-01 MED ORDER — NORTRIPTYLINE 10 MG CAPSULE
10 | ORAL_CAPSULE | ORAL | 2 refills | 90.00000 days | Status: AC
Start: 2021-02-01 — End: 2021-04-04

## 2021-02-03 ENCOUNTER — Telehealth
Admit: 2021-02-03 | Payer: PRIVATE HEALTH INSURANCE | Attending: Vascular and Interventional Radiology | Primary: Family Medicine

## 2021-02-03 ENCOUNTER — Telehealth: Admit: 2021-02-03 | Payer: PRIVATE HEALTH INSURANCE | Primary: Family Medicine

## 2021-02-03 NOTE — Telephone Encounter
 Oncology Coronavirus RN Phone ScreenI have reviewed the reason for daily monitoring calls with the patient which include monitoring for worsening symptoms and providing education on self-isolation and symptoms to report. The patient does not agree to daily home monitoring calls.Date of COVID-19 test:  12/27/2021Onset of symptoms: n/a days ago.Are you a cancer patient receiving active treatment? NoHave you had a bone marrow or stem cell transplant, or CART cell therapy?  NoSymptomsFever: n/aLast temperature taken: n/a	Last antipyretic taken: n/a 	            Respiratory symptoms: Other: n/a            Patient reports the following additional symptoms: n/a	Are any of these symptoms new or worsening?  n/a 1Do you have any additional health problems including:[]  Chronic lung disease - e.g., asthma, chronic obstructive pulmonary disease [chronic bronchitis or emphysema], or other chronic conditions associated with impaired lung function or that require home oxygen  []   Congestive heart failure []   Diabetes with complications - e.g., limb amputation, kidney disease, vision problems, heart disease, history of stroke, or, especially if they have uncontrolled diabetes or other complications  []    Neurological conditions that weaken ability to cough - e.g., disorders of the brain, spinal cord, peripheral nerve, and muscle such as cerebral palsy, epilepsy [seizure disorders], stroke, intellectual disability, moderate to severe developmental delay, muscular dystrophy, or spinal cord injury []   People with weakened immune systems - e.g., seeing a doctor for cancer and treatment such as chemotherapy or radiation, received an organ or bone marrow transplant, taking high doses of oral steroids or other immunosuppressant medications, HIV or AIDS []   Dialysis - e.g., patient is under treatment for kidney disease, including receiving dialysis, or has been told they have chronic kidney disease  []   Cirrhosis of the liver - e.g., cirrhosis, chronic hepatitis, patient is under treatment for or has been told they have liver disease []   Extreme obesity - e.g., Body Mass Index [BMI] greater than or equal to 40 []   Pregnancy - e.g., in the last two weeks or currently Home Monitoring: Plan (Follow Adult Oncology Triage for COVID-19 Exposure, Fever and/or Respiratory Symptoms): Patient appears to have resolved symptoms.Counseled patient to Symptoms resolved discontinue call monitoring as it has been more than 10-20 days since + COVID test.Patient verbalized understanding. Discussed plan with patient's provider: n/aAdditionally counseled patient on instructions below, including CDC Infosheet ?Steps to help prevent the spread of COVID-19 if you are sick.?Patient verbalized understanding.Confirmed date/time of next appointment with patient.Self Care Instructions:?	Stay home until the following:     a. At least 3 days (72 hours) have passed since recovery defined as resolution of fever without the use of fever- reducing medications and     b. Improvement in respiratory symptoms (e.g., cough, shortness of breath); and     c. For patients with mild to moderate illness, isolation can be discontinued 10 days after onset of symptoms and at least 24 hours of clinical improvement and without fever off anti-pyretics.         For patients with more severe disease (required ICU or stepdown admission) or who are immunocompromised, isolation can be discontinued after 20 days since onset of symptoms and with at least 24 hours of clinical improvement and without fever off anti-pyretics. Immunocompromised is defined by:	o   High dose steroids (20mg  prednisone or equivalent for >14 days)	o   CD4 T lymphocyte count <200	o   Active treatment with cytotoxic chemotherapy or immunotherapy	o   History of  CAR-T cell therapy	o   History of autologous hematopoietic stem cell transplant	o   History of allogeneic hematopoietic stem cell transplant	o   History of solid organ transplant 	?	Rest, monitor temperature, increase fluids, report fever of 100.7F or more.?	Cough into tissue; good handwashing; dispose of tissues properly.?	Call back if fever returns, you develop shortness of breath, chest pain, or severe weakness or any worsening symptoms.?	Discuss with your team when it is safe to return to your usual location of care.?	Patient agrees with plan and verbalizes understanding.Get help right away if:?	You feel pain or pressure in your chest.?	You have difficulty catching your breath?	You faint or feel like you will faint.?	You keep throwing up (vomiting).?	You feel confused.If you experience any new or worsening symptoms call your healthcare team or call 911 for immediate response.

## 2021-02-03 NOTE — Telephone Encounter
Counseled patient to Symptoms resolved discontinue call monitoring

## 2021-02-07 ENCOUNTER — Telehealth: Admit: 2021-02-07 | Payer: PRIVATE HEALTH INSURANCE | Primary: Family Medicine

## 2021-02-07 NOTE — Telephone Encounter
Counseled patient to Symptoms resolved discontinue call monitoring

## 2021-04-04 MED ORDER — NORTRIPTYLINE 10 MG CAPSULE
10 | ORAL_CAPSULE | ORAL | 2 refills | 90.00000 days | Status: AC
Start: 2021-04-04 — End: 2021-06-15

## 2021-05-29 ENCOUNTER — Encounter: Admit: 2021-05-29 | Payer: PRIVATE HEALTH INSURANCE | Attending: Internal Medicine | Primary: Family Medicine

## 2021-05-30 MED ORDER — MONTELUKAST 10 MG TABLET
10 mg | ORAL_TABLET | Freq: Every day | ORAL | 4 refills | Status: AC
Start: 2021-05-30 — End: 2021-09-18

## 2021-06-15 MED ORDER — NORTRIPTYLINE 10 MG CAPSULE
10 | ORAL_CAPSULE | ORAL | 1 refills | 90.00000 days | Status: AC
Start: 2021-06-15 — End: 2021-07-20

## 2021-07-20 MED ORDER — NORTRIPTYLINE 10 MG CAPSULE
10 | ORAL_CAPSULE | ORAL | 1 refills | 90.00000 days | Status: AC
Start: 2021-07-20 — End: 2021-08-17

## 2021-07-27 ENCOUNTER — Encounter: Admit: 2021-07-27 | Payer: PRIVATE HEALTH INSURANCE | Attending: Obstetrics and Gynecology | Primary: Family Medicine

## 2021-07-27 MED ORDER — ESTRADIOL 2 MG TABLET
2 mg | ORAL_TABLET | 2 refills | Status: AC
Start: 2021-07-27 — End: 2022-01-25

## 2021-08-17 MED ORDER — NORTRIPTYLINE 10 MG CAPSULE
10 | ORAL_CAPSULE | ORAL | 1 refills | 90.00000 days | Status: AC
Start: 2021-08-17 — End: 2021-09-18

## 2021-09-18 ENCOUNTER — Encounter: Admit: 2021-09-18 | Payer: PRIVATE HEALTH INSURANCE | Primary: Family Medicine

## 2021-09-18 ENCOUNTER — Encounter: Admit: 2021-09-18 | Payer: PRIVATE HEALTH INSURANCE | Attending: Allergy and Immunology | Primary: Family Medicine

## 2021-09-18 MED ORDER — NORTRIPTYLINE 10 MG CAPSULE
10 | ORAL_CAPSULE | ORAL | 2 refills | 90.00000 days | Status: AC
Start: 2021-09-18 — End: 2021-10-11

## 2021-09-18 MED ORDER — MONTELUKAST 10 MG TABLET
10 mg | ORAL_TABLET | Freq: Every day | ORAL | 4 refills | Status: AC
Start: 2021-09-18 — End: 2022-04-27

## 2021-09-18 NOTE — Telephone Encounter
singulair refill requested

## 2021-09-19 ENCOUNTER — Encounter: Admit: 2021-09-19 | Payer: PRIVATE HEALTH INSURANCE | Attending: Allergy and Immunology | Primary: Family Medicine

## 2021-09-19 ENCOUNTER — Ambulatory Visit: Admit: 2021-09-19 | Payer: PRIVATE HEALTH INSURANCE | Attending: Allergy and Immunology | Primary: Family Medicine

## 2021-09-19 DIAGNOSIS — C20 Malignant neoplasm of rectum: Secondary | ICD-10-CM

## 2021-09-19 DIAGNOSIS — M1990 Unspecified osteoarthritis, unspecified site: Secondary | ICD-10-CM

## 2021-09-19 DIAGNOSIS — J309 Allergic rhinitis, unspecified: Secondary | ICD-10-CM

## 2021-09-19 DIAGNOSIS — R922 Inconclusive mammogram: Secondary | ICD-10-CM

## 2021-09-19 DIAGNOSIS — D219 Benign neoplasm of connective and other soft tissue, unspecified: Secondary | ICD-10-CM

## 2021-09-19 DIAGNOSIS — N921 Excessive and frequent menstruation with irregular cycle: Secondary | ICD-10-CM

## 2021-09-19 DIAGNOSIS — G43909 Migraine, unspecified, not intractable, without status migrainosus: Secondary | ICD-10-CM

## 2021-09-19 MED ORDER — MONTELUKAST 10 MG TABLET
10 mg | ORAL_TABLET | Freq: Every day | ORAL | 4 refills | Status: AC
Start: 2021-09-19 — End: ?

## 2021-09-19 MED ORDER — ALBUTEROL SULFATE HFA 90 MCG/ACTUATION AEROSOL INHALER
90 mcg/actuation | Freq: Four times a day (QID) | RESPIRATORY_TRACT | 3 refills | Status: AC | PRN
Start: 2021-09-19 — End: ?

## 2021-09-19 MED ORDER — PREVIDENT 5000 ORTHO DEFENSE 1.1 % DENTAL PASTE
1.1 | ORAL | 1.00 refills | 84.00000 days | Status: AC
Start: 2021-09-19 — End: 2023-01-10
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
## 2021-09-20 DIAGNOSIS — J45909 Unspecified asthma, uncomplicated: Secondary | ICD-10-CM

## 2021-10-05 ENCOUNTER — Inpatient Hospital Stay: Admit: 2021-10-05 | Discharge: 2021-10-05 | Payer: PRIVATE HEALTH INSURANCE | Primary: Family Medicine

## 2021-10-05 DIAGNOSIS — E038 Other specified hypothyroidism: Secondary | ICD-10-CM

## 2021-10-05 DIAGNOSIS — Z0000 Encounter for general adult medical examination without abnormal findings: Secondary | ICD-10-CM

## 2021-10-05 LAB — CBC WITH AUTO DIFFERENTIAL
BKR CO2: 0.22 x 1000/??L (ref 0.00–1.00)
BKR GLUCOSE: 13.9 % (ref 11.0–15.0)
BKR POTASSIUM: 39.7 % (ref 35.00–45.00)
BKR WAM ABSOLUTE LYMPHOCYTE COUNT.: 2.04 x 1000/??L (ref 0.60–3.70)
BKR WAM ABSOLUTE NRBC (2 DEC): 0 x 1000/??L (ref 0.00–1.00)
BKR WAM ANALYZER ANC: 4.66 x 1000/??L (ref 2.00–7.60)
BKR WAM BASOPHIL ABSOLUTE COUNT.: 0.09 x 1000/??L (ref 0.00–1.00)
BKR WAM BASOPHILS: 1.2 % (ref 0.0–1.4)
BKR WAM EOSINOPHIL ABSOLUTE COUNT.: 0.22 x 1000/??L (ref 0.00–1.00)
BKR WAM EOSINOPHILS: 2.9 % (ref 0.0–5.0)
BKR WAM HEMATOCRIT (2 DEC): 39.7 % (ref 35.00–45.00)
BKR WAM HEMOGLOBIN: 13.1 g/dL (ref 11.7–15.5)
BKR WAM IMMATURE GRANULOCYTES: 0.3 % (ref 0.0–1.0)
BKR WAM LYMPHOCYTES: 27.1 % (ref 17.0–50.0)
BKR WAM MCH (PG): 30 pg (ref 27.0–33.0)
BKR WAM MCHC: 33 g/dL (ref 31.0–36.0)
BKR WAM MCV: 91.1 fL (ref 80.0–100.0)
BKR WAM MONOCYTE ABSOLUTE COUNT.: 0.51 x 1000/??L (ref 0.00–1.00)
BKR WAM MONOCYTES: 6.8 % (ref 4.0–12.0)
BKR WAM MPV: 9.7 fL (ref 8.0–12.0)
BKR WAM NEUTROPHILS: 61.7 % (ref 39.0–72.0)
BKR WAM NUCLEATED RED BLOOD CELLS: 0 % (ref 0.0–1.0)
BKR WAM PLATELETS: 347 x1000/??L (ref 150–420)
BKR WAM RDW-CV: 13.9 % (ref 11.0–15.0)
BKR WAM RED BLOOD CELL COUNT.: 4.36 M/??L (ref 4.00–6.00)
BKR WAM WHITE BLOOD CELL COUNT: 7.5 x1000/??L (ref 4.0–11.0)

## 2021-10-05 LAB — COMPREHENSIVE METABOLIC PANEL
BKR A/G RATIO: 1.8 (ref 1.0–2.2)
BKR ALANINE AMINOTRANSFERASE (ALT): 19 U/L (ref 10–35)
BKR ALBUMIN: 4.4 g/dL (ref 3.6–4.9)
BKR ALKALINE PHOSPHATASE: 88 U/L (ref 9–122)
BKR ANION GAP: 9 g/dL (ref 7–17)
BKR ASPARTATE AMINOTRANSFERASE (AST): 21 U/L (ref 10–35)
BKR AST/ALT RATIO: 1.1
BKR BILIRUBIN TOTAL: <0.2 mg/dL (ref <=1.2)
BKR BLOOD UREA NITROGEN: 14 mg/dL (ref 6–20)
BKR BUN / CREAT RATIO: 20.6 (ref 8.0–23.0)
BKR CALCIUM: 9.7 mg/dL (ref 8.8–10.2)
BKR CHLORIDE: 101 mmol/L (ref 98–107)
BKR CO2: 28 mmol/L (ref 20–30)
BKR CREATININE: 0.68 mg/dL (ref 0.40–1.30)
BKR EGFR, CREATININE (CKD-EPI 2021): >60 mL/min/{1.73_m2} (ref >=60)
BKR GLOBULIN: 2.5 g/dL (ref 2.3–3.5)
BKR PROTEIN TOTAL: 6.9 g/dL (ref 6.6–8.7)
BKR SODIUM: 138 mmol/L (ref 136–144)

## 2021-10-05 LAB — LIPID PANEL
BKR CHOLESTEROL/HDL RATIO: 1.6 (ref 0.0–5.0)
BKR CHOLESTEROL: 244 mg/dL — ABNORMAL HIGH
BKR HDL CHOLESTEROL: 148 mg/dL (ref >=40)
BKR LDL CHOLESTEROL SAMPSON CALCULATED: 82 mg/dL (ref 4.0–11.0)
BKR TRIGLYCERIDES: 85 mg/dL

## 2021-10-05 LAB — TSH W/REFLEX TO FT4     (BH GH LMW Q YH): BKR THYROID STIMULATING HORMONE: 3.36 ??IU/mL

## 2021-10-06 NOTE — Other
Read verbatim by Physician. Left detailed message for the patient.

## 2021-10-06 NOTE — Other
Labs ok Proceed as planned Call if any concerns

## 2021-10-11 MED ORDER — NORTRIPTYLINE 10 MG CAPSULE
10 | ORAL_CAPSULE | ORAL | 2 refills | 90.00000 days | Status: AC
Start: 2021-10-11 — End: 2021-10-26

## 2021-10-26 MED ORDER — NORTRIPTYLINE 10 MG CAPSULE
10 | ORAL_CAPSULE | ORAL | 2 refills | 90.00000 days | Status: AC
Start: 2021-10-26 — End: 2022-05-25

## 2021-10-28 ENCOUNTER — Encounter
Admit: 2021-10-28 | Payer: PRIVATE HEALTH INSURANCE | Attending: Vascular and Interventional Radiology | Primary: Family Medicine

## 2021-12-27 ENCOUNTER — Encounter: Admit: 2021-12-27 | Payer: PRIVATE HEALTH INSURANCE | Attending: Family Medicine

## 2022-01-02 ENCOUNTER — Inpatient Hospital Stay: Admit: 2022-01-02 | Discharge: 2022-01-02 | Payer: PRIVATE HEALTH INSURANCE | Primary: Family Medicine

## 2022-01-02 DIAGNOSIS — T781XXA Other adverse food reactions, not elsewhere classified, initial encounter: Secondary | ICD-10-CM

## 2022-01-06 LAB — ALLERGEN, FOOD ALLERGY PROFILE
BKR ALLERGEN CLAM IGE CONC: <0.1 kU/L (ref <0.10)
BKR ALLERGEN CODFISH IGE CONC: <0.1 kUA/L (ref 6–<0.10)
BKR ALLERGEN COW'S MILK IGE CONC: <0.1 kUA/L (ref 0.40–<0.10)
BKR ALLERGEN EGG WHITE IGE CONC: <0.1 kU/L (ref <0.10)
BKR ALLERGEN PEANUT IGE CONC: 0.16 kUA/L — ABNORMAL HIGH (ref 8.0–<0.10)
BKR ALLERGEN SESAME SEED IGE CONC: <0.1 kU/L (ref <0.10)
BKR ALLERGEN SHRIMP IGE CONC: <0.1 kUA/L (ref 0.0–<0.10)
BKR ALLERGEN SOYBEAN IGE CONC: <0.1 kU/L (ref <0.10)
BKR ALLERGEN WALNUT IGE CONC: <0.1 kUA/L (ref 0.0–<0.10)
BKR ALLERGEN WHEAT IGE CONC: <0.1 kU/L (ref <0.10)

## 2022-01-25 ENCOUNTER — Encounter: Admit: 2022-01-25 | Payer: PRIVATE HEALTH INSURANCE | Attending: Obstetrics and Gynecology | Primary: Family Medicine

## 2022-01-25 MED ORDER — ESTRADIOL 2 MG TABLET
2 mg | ORAL_TABLET | 2 refills | Status: AC
Start: 2022-01-25 — End: 2022-06-04

## 2022-02-10 ENCOUNTER — Encounter: Admit: 2022-02-10 | Payer: PRIVATE HEALTH INSURANCE | Attending: Family Medicine

## 2022-02-12 ENCOUNTER — Encounter: Admit: 2022-02-12 | Payer: PRIVATE HEALTH INSURANCE | Attending: Family Medicine

## 2022-02-23 ENCOUNTER — Inpatient Hospital Stay: Admit: 2022-02-23 | Discharge: 2022-02-23 | Payer: PRIVATE HEALTH INSURANCE | Primary: Family Medicine

## 2022-02-23 DIAGNOSIS — K591 Functional diarrhea: Secondary | ICD-10-CM

## 2022-03-01 LAB — ALLERGEN GLUTEN IGE: BKR ALLERGEN GLUTEN IGE CONC: <0.1 kU/L (ref <0.10)

## 2022-03-08 ENCOUNTER — Encounter: Admit: 2022-03-08 | Payer: PRIVATE HEALTH INSURANCE | Attending: Internal Medicine | Primary: Family Medicine

## 2022-03-08 ENCOUNTER — Telehealth: Admit: 2022-03-08 | Payer: PRIVATE HEALTH INSURANCE | Attending: Internal Medicine | Primary: Family Medicine

## 2022-03-08 ENCOUNTER — Telehealth: Admit: 2022-03-08 | Payer: PRIVATE HEALTH INSURANCE | Attending: Hematology & Oncology | Primary: Family Medicine

## 2022-03-08 DIAGNOSIS — Z1211 Encounter for screening for malignant neoplasm of colon: Secondary | ICD-10-CM

## 2022-03-08 NOTE — Telephone Encounter
Patient called and would like to sched follow up appt with Dr.Lim. Stated she was having some GI issues

## 2022-03-08 NOTE — Telephone Encounter
Patient calling to speak with Dr. Hyacinth Meeker. She wanted to schedule her colonoscopy, but was told she needed a pre colon visit. She is concerned about waiting to schedule a pre colon visit. In 2016, Dr. Hyacinth Meeker diagnosed her with colon cancer and she was supposed to have a colonoscopy every year for 5 years but she thinks she only did 4 years. She is starting to have bleeding and other issues and thinks she needs the colonoscopy as soon as possible. She already saw her pcp who did other testing.

## 2022-03-09 ENCOUNTER — Telehealth: Admit: 2022-03-09 | Payer: PRIVATE HEALTH INSURANCE | Attending: Internal Medicine | Primary: Family Medicine

## 2022-03-09 ENCOUNTER — Encounter: Admit: 2022-03-09 | Payer: PRIVATE HEALTH INSURANCE | Primary: Family Medicine

## 2022-03-09 NOTE — Telephone Encounter
Dr. Hyacinth Meeker approved open access:  Yes, ok for procedure without a pre-screen visit. Double check not taking prescribed blood thinners ( aspirin and nsaids are fine) Stop fish oil x 5days. Suprep liquid or sutab pills ( sutab - which with coupon is max 40$) - [email or web address] -> send me request and will write Rx. If constipated lately, then miralax 1-2 caps every night x 5 days prior. MUST have ride and NOT drive to next day NPO time is NO MORE WATER or GUM/etc Thanks! HM Called patient to schedule colonoscopy. No answer. Left voicemail and sent Myc message asking for call back.

## 2022-03-16 ENCOUNTER — Encounter: Admit: 2022-03-16 | Payer: PRIVATE HEALTH INSURANCE | Primary: Family Medicine

## 2022-03-16 ENCOUNTER — Telehealth: Admit: 2022-03-16 | Payer: PRIVATE HEALTH INSURANCE | Primary: Family Medicine

## 2022-03-16 NOTE — Telephone Encounter
Called patient and LVM to schedule Colonoscopy w/Dr Miller-2x times called

## 2022-03-23 NOTE — Telephone Encounter
Spoke to pt. and scheduled colon- I relayed messages from HM and went over pre op, no blood thinners. I called in suprep and sent prep sheet

## 2022-03-26 ENCOUNTER — Encounter: Admit: 2022-03-26 | Payer: PRIVATE HEALTH INSURANCE | Primary: Family Medicine

## 2022-03-27 ENCOUNTER — Encounter: Admit: 2022-03-27 | Payer: PRIVATE HEALTH INSURANCE | Attending: Family Medicine

## 2022-04-27 ENCOUNTER — Telehealth: Admit: 2022-04-27 | Payer: PRIVATE HEALTH INSURANCE | Attending: Hematology & Oncology | Primary: Family Medicine

## 2022-04-27 ENCOUNTER — Inpatient Hospital Stay: Admit: 2022-04-27 | Discharge: 2022-04-27 | Payer: PRIVATE HEALTH INSURANCE | Primary: Family Medicine

## 2022-04-27 ENCOUNTER — Ambulatory Visit: Admit: 2022-04-27 | Payer: PRIVATE HEALTH INSURANCE | Attending: Hematology & Oncology | Primary: Family Medicine

## 2022-04-27 DIAGNOSIS — C20 Malignant neoplasm of rectum: Secondary | ICD-10-CM

## 2022-04-27 LAB — CBC WITH AUTO DIFFERENTIAL
BKR WAM ABSOLUTE IMMATURE GRANULOCYTES.: 0.02 x 1000/??L (ref 0.00–0.30)
BKR WAM ABSOLUTE IMMATURE GRANULOCYTES.: 34.3 g/dL (ref 31.0–36.0)
BKR WAM ABSOLUTE LYMPHOCYTE COUNT.: 1.94 x 1000/??L (ref 0.60–3.70)
BKR WAM ABSOLUTE NRBC (2 DEC): 0 x 1000/??L (ref 0.00–1.00)
BKR WAM ANALYZER ANC: 2.71 x 1000/??L (ref 2.00–7.60)
BKR WAM BASOPHIL ABSOLUTE COUNT.: 0.06 x 1000/??L (ref 0.00–1.00)
BKR WAM BASOPHILS: 1.1 % (ref 0.0–1.4)
BKR WAM EOSINOPHIL ABSOLUTE COUNT.: 0.15 x 1000/??L (ref 0.00–1.00)
BKR WAM EOSINOPHILS: 2.9 % (ref 0.0–5.0)
BKR WAM HEMATOCRIT (2 DEC): 39.9 % (ref 35.00–45.00)
BKR WAM HEMOGLOBIN: 13.7 g/dL (ref 11.7–15.5)
BKR WAM IMMATURE GRANULOCYTES: 0.4 % (ref 0.0–1.0)
BKR WAM LYMPHOCYTES: 37 % (ref 17.0–50.0)
BKR WAM MCH (PG): 30.6 pg (ref 27.0–33.0)
BKR WAM MCHC: 34.3 g/dL (ref 31.0–36.0)
BKR WAM MCV: 89.1 fL (ref 80.0–100.0)
BKR WAM MONOCYTE ABSOLUTE COUNT.: 0.36 x 1000/??L (ref 0.00–1.00)
BKR WAM MONOCYTES: 6.9 % (ref 4.0–12.0)
BKR WAM MPV: 9.3 fL (ref 8.0–12.0)
BKR WAM NEUTROPHILS: 51.7 % (ref 39.0–72.0)
BKR WAM RDW-CV: 13.2 % (ref 11.0–15.0)
BKR WAM RED BLOOD CELL COUNT.: 4.48 M/??L (ref 4.00–6.00)
BKR WAM WHITE BLOOD CELL COUNT: 5.2 x1000/??L (ref 4.0–11.0)

## 2022-04-27 LAB — COMPREHENSIVE METABOLIC PANEL
BKR A/G RATIO: 1.3 (ref 1.0–2.2)
BKR ALANINE AMINOTRANSFERASE (ALT): 23 U/L (ref 10–35)
BKR ALBUMIN: 4 g/dL (ref 3.6–4.9)
BKR ALKALINE PHOSPHATASE: 82 U/L (ref 9–122)
BKR ANION GAP: 12 x 1000/??L (ref 7–17)
BKR ASPARTATE AMINOTRANSFERASE (AST): 22 U/L (ref 10–35)
BKR AST/ALT RATIO: 1 x 1000/??L (ref 0.00–1.00)
BKR BILIRUBIN TOTAL: 0.3 mg/dL (ref <=1.2)
BKR BLOOD UREA NITROGEN: 12 mg/dL (ref 6–20)
BKR BUN / CREAT RATIO: 16.4 % (ref 8.0–23.0)
BKR CALCIUM: 9.3 mg/dL (ref 8.8–10.2)
BKR CHLORIDE: 100 mmol/L (ref 98–107)
BKR CREATININE: 0.73 mg/dL (ref 0.40–1.30)
BKR EGFR, CREATININE (CKD-EPI 2021): >60 mL/min/{1.73_m2} (ref >=60)
BKR GLOBULIN: 3 g/dL (ref 2.3–3.5)
BKR GLUCOSE: 94 mg/dL (ref 70–100)
BKR POTASSIUM: 3.9 mmol/L (ref 3.3–5.3)
BKR PROTEIN TOTAL: 7 g/dL (ref 6.6–8.7)
BKR SODIUM: 137 mmol/L (ref 136–144)
BKR WAM NUCLEATED RED BLOOD CELLS: 22 U/L (ref 10–35)
BKR WAM PLATELETS: 0.73 mg/dL (ref 0.40–1.30)

## 2022-04-27 NOTE — Telephone Encounter
Lab orders placed.  
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
## 2022-04-28 LAB — CEA: BKR CARCINOEMBRYONIC ANTIGEN (YH ROCHE): <1.8 ng/mL

## 2022-05-02 ENCOUNTER — Encounter: Admit: 2022-05-02 | Payer: PRIVATE HEALTH INSURANCE | Attending: Family Medicine

## 2022-05-02 MED ORDER — LISDEXAMFETAMINE 20 MG CAPSULE
20 | ORAL_CAPSULE | Freq: Every morning | ORAL | 1 refills | 30.00000 days | Status: AC
Start: 2022-05-02 — End: ?

## 2022-05-04 ENCOUNTER — Inpatient Hospital Stay: Admit: 2022-05-04 | Discharge: 2022-05-04 | Payer: PRIVATE HEALTH INSURANCE | Primary: Family Medicine

## 2022-05-04 DIAGNOSIS — E038 Other specified hypothyroidism: Secondary | ICD-10-CM

## 2022-05-04 DIAGNOSIS — E538 Deficiency of other specified B group vitamins: Secondary | ICD-10-CM

## 2022-05-04 LAB — VITAMIN B12: BKR VITAMIN B12: 1083 pg/mL (ref 232–1245)

## 2022-05-05 LAB — TSH W/REFLEX TO FT4     (BH GH LMW Q YH): BKR THYROID STIMULATING HORMONE: 2.63 ??IU/mL (ref 0.00–0.30)

## 2022-05-07 ENCOUNTER — Encounter: Admit: 2022-05-07 | Payer: PRIVATE HEALTH INSURANCE | Primary: Family Medicine

## 2022-05-07 ENCOUNTER — Ambulatory Visit: Admit: 2022-05-07 | Payer: PRIVATE HEALTH INSURANCE | Primary: Family Medicine

## 2022-05-07 DIAGNOSIS — N921 Excessive and frequent menstruation with irregular cycle: Secondary | ICD-10-CM

## 2022-05-07 DIAGNOSIS — R922 Inconclusive mammogram: Secondary | ICD-10-CM

## 2022-05-07 DIAGNOSIS — C20 Malignant neoplasm of rectum: Secondary | ICD-10-CM

## 2022-05-07 DIAGNOSIS — Z1211 Encounter for screening for malignant neoplasm of colon: Secondary | ICD-10-CM

## 2022-05-07 DIAGNOSIS — D219 Benign neoplasm of connective and other soft tissue, unspecified: Secondary | ICD-10-CM

## 2022-05-07 DIAGNOSIS — M1990 Unspecified osteoarthritis, unspecified site: Secondary | ICD-10-CM

## 2022-05-07 DIAGNOSIS — J45909 Unspecified asthma, uncomplicated: Secondary | ICD-10-CM

## 2022-05-07 DIAGNOSIS — G43909 Migraine, unspecified, not intractable, without status migrainosus: Secondary | ICD-10-CM
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
## 2022-05-08 ENCOUNTER — Ambulatory Visit: Admit: 2022-05-08 | Payer: PRIVATE HEALTH INSURANCE | Primary: Family Medicine

## 2022-05-08 ENCOUNTER — Encounter: Admit: 2022-05-08 | Payer: PRIVATE HEALTH INSURANCE | Primary: Family Medicine

## 2022-05-08 ENCOUNTER — Inpatient Hospital Stay: Admit: 2022-05-08 | Discharge: 2022-05-08 | Payer: PRIVATE HEALTH INSURANCE | Primary: Family Medicine

## 2022-05-08 DIAGNOSIS — Z90710 Acquired absence of both cervix and uterus: Secondary | ICD-10-CM

## 2022-05-08 DIAGNOSIS — Z8041 Family history of malignant neoplasm of ovary: Secondary | ICD-10-CM

## 2022-05-08 DIAGNOSIS — C20 Malignant neoplasm of rectum: Secondary | ICD-10-CM

## 2022-05-08 DIAGNOSIS — Z85038 Personal history of other malignant neoplasm of large intestine: Secondary | ICD-10-CM

## 2022-05-08 DIAGNOSIS — Z7951 Long term (current) use of inhaled steroids: Secondary | ICD-10-CM

## 2022-05-08 DIAGNOSIS — Z800 Family history of malignant neoplasm of digestive organs: Secondary | ICD-10-CM

## 2022-05-08 DIAGNOSIS — M19042 Primary osteoarthritis, left hand: Secondary | ICD-10-CM

## 2022-05-08 DIAGNOSIS — Z85048 Personal history of other malignant neoplasm of rectum, rectosigmoid junction, and anus: Secondary | ICD-10-CM

## 2022-05-08 DIAGNOSIS — Z980 Intestinal bypass and anastomosis status: Secondary | ICD-10-CM

## 2022-05-08 DIAGNOSIS — D219 Benign neoplasm of connective and other soft tissue, unspecified: Secondary | ICD-10-CM

## 2022-05-08 DIAGNOSIS — N921 Excessive and frequent menstruation with irregular cycle: Secondary | ICD-10-CM

## 2022-05-08 DIAGNOSIS — K648 Other hemorrhoids: Secondary | ICD-10-CM

## 2022-05-08 DIAGNOSIS — M19041 Primary osteoarthritis, right hand: Secondary | ICD-10-CM

## 2022-05-08 DIAGNOSIS — M1990 Unspecified osteoarthritis, unspecified site: Secondary | ICD-10-CM

## 2022-05-08 DIAGNOSIS — Z79899 Other long term (current) drug therapy: Secondary | ICD-10-CM

## 2022-05-08 DIAGNOSIS — Z9049 Acquired absence of other specified parts of digestive tract: Secondary | ICD-10-CM

## 2022-05-08 DIAGNOSIS — J45909 Unspecified asthma, uncomplicated: Secondary | ICD-10-CM

## 2022-05-08 DIAGNOSIS — D125 Benign neoplasm of sigmoid colon: Secondary | ICD-10-CM

## 2022-05-08 DIAGNOSIS — Z803 Family history of malignant neoplasm of breast: Secondary | ICD-10-CM

## 2022-05-08 DIAGNOSIS — R922 Inconclusive mammogram: Secondary | ICD-10-CM

## 2022-05-08 DIAGNOSIS — G43909 Migraine, unspecified, not intractable, without status migrainosus: Secondary | ICD-10-CM

## 2022-05-08 DIAGNOSIS — Z1211 Encounter for screening for malignant neoplasm of colon: Secondary | ICD-10-CM

## 2022-05-08 MED ORDER — LACTATED RINGERS INTRAVENOUS SOLUTION
INTRAVENOUS | Status: DC
Start: 2022-05-08 — End: 2022-05-13
  Administered 2022-05-08: 14:00:00 1000.000 mL/h via INTRAVENOUS

## 2022-05-08 MED ORDER — PROPOFOL 10 MG/ML INTRAVENOUS EMULSION
10 mg/mL | INTRAVENOUS | Status: DC | PRN
Start: 2022-05-08 — End: 2022-05-08
  Administered 2022-05-08 (×5): 10 mg/mL via INTRAVENOUS

## 2022-05-08 MED ORDER — LIDOCAINE (PF) 20 MG/ML (2 %) INJECTION SOLUTION
20 mg/mL (2 %) | INTRAVENOUS | Status: DC | PRN
Start: 2022-05-08 — End: 2022-05-08
  Administered 2022-05-08: 15:00:00 20 mg/mL (2 %) via INTRAVENOUS

## 2022-05-08 NOTE — Other
Operative Diagnosis:Pre-op:   History of colon cancer [Z85.038] Patient Coded Diagnosis   Pre-op diagnosis: History of colon cancer  Post-op diagnosis: History of colon cancer  Patient Diagnosis   Pre-op diagnosis: History of colon cancer [Z85.038]  Post-op diagnosis: polyp, hemorrhoids    Post-op diagnosis:   * History of colon cancer [Z85.038]Operative Procedure(s) :Procedure(s) (LRB):COLONOSCOPY, FLEXIBLE; DX, W/WO SPECIMENS/COLON DECOMP (SEP PROC) (N/A)Post-op Procedure & Diagnosis ConfirmationPost-op Diagnosis: Post-op Diagnosis confirmed (no changes)Post-op Procedure: Post-op Procedure confirmed (no changes)Anesthesia ClarifiersGI/Endoscopy: Planned Screening Colonoscopy - Procedure Performed (see above)

## 2022-05-08 NOTE — Anesthesia Post-Procedure Evaluation
Anesthesia Post-op NotePatient: Emily Conte GannonProcedure(s):  Procedure(s) (LRB):COLONOSCOPY, FLEXIBLE; DX, W/WO SPECIMENS/COLON DECOMP (SEP PROC) (N/A) Patient location: PACULast Vitals:  I have noted the vital signs as listed in the nursing notes.Mental status recovered: patient participates in evaluation: YesVital signs reviewed: YesRespiratory function stable:YesAirway is patent: YesCardiovascular function and hydration status stable: YesPain control satisfactory: YesNausea and vomiting control satisfactory:YesThere were no known notable events for this encounter.
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
## 2022-05-08 NOTE — Other
Post Anesthesia Transfer of Care NotePatient: Braulio Conte GannonProcedure(s) Performed: Procedure(s) (LRB):COLONOSCOPY, FLEXIBLE; DX, W/WO SPECIMENS/COLON DECOMP (SEP PROC) (N/A) Patient location: PACU Last Vitals: Vitals Value Taken Time BP 99/55 05/08/22 1116 Temp 97 05/08/22 1116 Pulse 78 05/08/22 1116 Resp 16 05/08/22 1116 SpO2 100 % 05/08/22 1116 Vitals shown include unvalidated device data.Level of consciousness: sedatedTransport Vital Signs:  Stable since the last set of recorded intra-operative vital signsIntra-operative Complications: noneIntra-operative Intake & Output and Antibiotics as per Anesthesia record and discussed with the RN.

## 2022-05-08 NOTE — Discharge Instructions
ColonoscopyCare After Please read the instructions outlined below and refer to this sheet in the next few weeks. These discharge instructions provide you with general information on caring for yourself after you leave the hospital. Your doctor may also give you specific instructions. While your treatment has been planned according to the most current medical practices available, unavoidable complications occasionally occur. If you have any problems or questions after discharge, please call your doctor. HOME CARE INSTRUCTIONS Activity You may resume your regular activity but move at a slower pace for the next 24 hours. Take frequent rest periods for the next 24 hours. Walking will help expel (get rid of) the air and reduce the bloated feeling in your abdomen. No driving until tomorrow (because of the anesthesia used during the test). Do not sign any important legal documents or operate any machinery for 24 hours (because of the anesthesia used during the test).Nutrition Drink plenty of fluids. You may resume your normal diet. Avoid alcoholic beverages for 24 hours or as instructed by your caregiver.Medications You may resume your normal medications unless your caregiver tells you otherwise. What you can expect today You may experience abdominal discomfort such as a feeling of fullness or gas pains. Follow-up If biopsies were performed or polyps removed , you will get results in MyChart.You will receive a letter in Mychart after 1 week from the doctor explaining the pathology. SEEK IMMEDIATE MEDICAL CARE IF: You have excessive nausea (feeling sick to your stomach) and/or vomiting. You have severe abdominal pain and distention (swelling).  You have a temperature over 100? F (37.8? C). You have rectal bleeding or vomiting of blood.

## 2022-05-10 ENCOUNTER — Encounter: Admit: 2022-05-10 | Payer: PRIVATE HEALTH INSURANCE | Attending: Internal Medicine | Primary: Family Medicine

## 2022-05-25 ENCOUNTER — Encounter: Admit: 2022-05-25 | Payer: PRIVATE HEALTH INSURANCE | Attending: Family Medicine

## 2022-05-25 MED ORDER — NORTRIPTYLINE 10 MG CAPSULE
10 | ORAL_CAPSULE | ORAL | 2 refills | 90.00000 days | Status: AC
Start: 2022-05-25 — End: 2022-11-16

## 2022-06-03 ENCOUNTER — Encounter: Admit: 2022-06-03 | Payer: PRIVATE HEALTH INSURANCE | Attending: Family Medicine

## 2022-06-03 ENCOUNTER — Encounter: Admit: 2022-06-03 | Payer: PRIVATE HEALTH INSURANCE | Attending: Obstetrics and Gynecology | Primary: Family Medicine

## 2022-06-04 MED ORDER — ESTRADIOL 2 MG TABLET
2 mg | ORAL_TABLET | 2 refills | Status: AC
Start: 2022-06-04 — End: 2022-07-09

## 2022-06-05 MED ORDER — LISDEXAMFETAMINE 20 MG CAPSULE
20 | ORAL_CAPSULE | Freq: Every morning | ORAL | 1 refills | 30.00000 days | Status: AC
Start: 2022-06-05 — End: ?

## 2022-06-19 ENCOUNTER — Encounter: Admit: 2022-06-19 | Payer: PRIVATE HEALTH INSURANCE | Attending: Family Medicine

## 2022-07-08 ENCOUNTER — Encounter: Admit: 2022-07-08 | Payer: PRIVATE HEALTH INSURANCE | Attending: Family Medicine

## 2022-07-08 ENCOUNTER — Encounter: Admit: 2022-07-08 | Payer: PRIVATE HEALTH INSURANCE | Attending: Obstetrics and Gynecology | Primary: Family Medicine

## 2022-07-09 ENCOUNTER — Encounter: Admit: 2022-07-09 | Payer: PRIVATE HEALTH INSURANCE | Attending: Family Medicine

## 2022-07-09 MED ORDER — LISDEXAMFETAMINE 20 MG CAPSULE
20 | ORAL_CAPSULE | Freq: Every morning | ORAL | 1 refills | 30.00000 days | Status: AC
Start: 2022-07-09 — End: 2022-08-17

## 2022-07-09 MED ORDER — ESTRADIOL 2 MG TABLET
2 mg | ORAL_TABLET | 1 refills | Status: AC
Start: 2022-07-09 — End: 2022-08-18

## 2022-07-12 ENCOUNTER — Encounter: Admit: 2022-07-12 | Payer: PRIVATE HEALTH INSURANCE | Attending: Obstetrics and Gynecology | Primary: Family Medicine

## 2022-07-12 DIAGNOSIS — R922 Inconclusive mammogram: Secondary | ICD-10-CM

## 2022-07-12 DIAGNOSIS — Z1231 Encounter for screening mammogram for malignant neoplasm of breast: Secondary | ICD-10-CM

## 2022-08-08 ENCOUNTER — Inpatient Hospital Stay: Admit: 2022-08-08 | Payer: PRIVATE HEALTH INSURANCE

## 2022-08-08 ENCOUNTER — Ambulatory Visit: Admit: 2022-08-08 | Payer: PRIVATE HEALTH INSURANCE | Primary: Family Medicine

## 2022-08-17 ENCOUNTER — Encounter: Admit: 2022-08-17 | Payer: PRIVATE HEALTH INSURANCE | Attending: Obstetrics and Gynecology | Primary: Family Medicine

## 2022-08-17 ENCOUNTER — Encounter: Admit: 2022-08-17 | Payer: PRIVATE HEALTH INSURANCE | Attending: Family Medicine

## 2022-08-17 MED ORDER — LISDEXAMFETAMINE 20 MG CAPSULE
20 | ORAL_CAPSULE | Freq: Every morning | ORAL | 1 refills | 30.00000 days | Status: AC
Start: 2022-08-17 — End: ?

## 2022-08-18 MED ORDER — ESTRADIOL 2 MG TABLET
2 mg | ORAL_TABLET | 1 refills | Status: AC
Start: 2022-08-18 — End: ?

## 2022-08-23 ENCOUNTER — Encounter: Admit: 2022-08-23 | Payer: PRIVATE HEALTH INSURANCE | Attending: Obstetrics and Gynecology | Primary: Family Medicine

## 2022-09-17 ENCOUNTER — Encounter: Admit: 2022-09-17 | Payer: PRIVATE HEALTH INSURANCE | Attending: Family Medicine

## 2022-09-18 ENCOUNTER — Encounter: Admit: 2022-09-18 | Payer: PRIVATE HEALTH INSURANCE | Attending: Family Medicine

## 2022-09-20 ENCOUNTER — Encounter: Admit: 2022-09-20 | Payer: Managed Care (Private) | Attending: Family Medicine

## 2022-09-20 ENCOUNTER — Encounter: Admit: 2022-09-20 | Payer: PRIVATE HEALTH INSURANCE | Attending: Family Medicine

## 2022-09-20 MED ORDER — LISDEXAMFETAMINE 20 MG CAPSULE
20 | ORAL_CAPSULE | Freq: Every morning | ORAL | 1 refills | 30.00000 days | Status: AC
Start: 2022-09-20 — End: 2022-10-22

## 2022-09-20 MED ORDER — LISDEXAMFETAMINE 20 MG CAPSULE
20 | ORAL | 1.00 refills | 30.00000 days | Status: AC
Start: 2022-09-20 — End: 2022-09-20

## 2022-09-25 ENCOUNTER — Ambulatory Visit: Admit: 2022-09-25 | Payer: PRIVATE HEALTH INSURANCE | Attending: Allergy and Immunology | Primary: Family Medicine

## 2022-09-25 ENCOUNTER — Encounter: Admit: 2022-09-25 | Payer: PRIVATE HEALTH INSURANCE | Attending: Allergy and Immunology | Primary: Family Medicine

## 2022-09-25 DIAGNOSIS — C20 Malignant neoplasm of rectum: Secondary | ICD-10-CM

## 2022-09-25 DIAGNOSIS — M1990 Unspecified osteoarthritis, unspecified site: Secondary | ICD-10-CM

## 2022-09-25 DIAGNOSIS — J45909 Unspecified asthma, uncomplicated: Secondary | ICD-10-CM

## 2022-09-25 DIAGNOSIS — G43909 Migraine, unspecified, not intractable, without status migrainosus: Secondary | ICD-10-CM

## 2022-09-25 DIAGNOSIS — Z7185 Encounter for immunization safety counseling: Secondary | ICD-10-CM

## 2022-09-25 DIAGNOSIS — D219 Benign neoplasm of connective and other soft tissue, unspecified: Secondary | ICD-10-CM

## 2022-09-25 DIAGNOSIS — R922 Inconclusive mammogram: Secondary | ICD-10-CM

## 2022-09-25 DIAGNOSIS — N921 Excessive and frequent menstruation with irregular cycle: Secondary | ICD-10-CM

## 2022-09-26 DIAGNOSIS — J309 Allergic rhinitis, unspecified: Secondary | ICD-10-CM

## 2022-09-26 DIAGNOSIS — J45909 Unspecified asthma, uncomplicated: Secondary | ICD-10-CM

## 2022-10-21 ENCOUNTER — Encounter: Admit: 2022-10-21 | Payer: PRIVATE HEALTH INSURANCE | Attending: Family Medicine

## 2022-10-22 ENCOUNTER — Encounter: Admit: 2022-10-22 | Payer: PRIVATE HEALTH INSURANCE | Attending: Family Medicine

## 2022-10-22 MED ORDER — LISDEXAMFETAMINE 20 MG CAPSULE
20 | ORAL_CAPSULE | Freq: Every morning | ORAL | 1 refills | 30.00000 days | Status: AC
Start: 2022-10-22 — End: 2022-10-26
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
## 2022-10-26 ENCOUNTER — Encounter: Admit: 2022-10-26 | Payer: PRIVATE HEALTH INSURANCE | Attending: Family Medicine

## 2022-10-26 MED ORDER — LISDEXAMFETAMINE 20 MG CAPSULE
20 | ORAL_CAPSULE | Freq: Every morning | ORAL | 1 refills | 30.00000 days | Status: AC
Start: 2022-10-26 — End: 2022-12-20

## 2022-11-16 ENCOUNTER — Encounter: Admit: 2022-11-16 | Payer: PRIVATE HEALTH INSURANCE | Attending: Family Medicine

## 2022-11-16 MED ORDER — NORTRIPTYLINE 10 MG CAPSULE
10 | ORAL_CAPSULE | ORAL | 2 refills | 90.00000 days | Status: AC
Start: 2022-11-16 — End: ?

## 2022-12-20 ENCOUNTER — Encounter: Admit: 2022-12-20 | Payer: PRIVATE HEALTH INSURANCE | Attending: Allergy and Immunology | Primary: Family Medicine

## 2022-12-20 ENCOUNTER — Encounter: Admit: 2022-12-20 | Payer: PRIVATE HEALTH INSURANCE | Attending: Family Medicine

## 2022-12-20 MED ORDER — MONTELUKAST 10 MG TABLET
10 mg | ORAL_TABLET | Freq: Every day | ORAL | 4 refills | Status: AC
Start: 2022-12-20 — End: ?

## 2022-12-20 MED ORDER — LISDEXAMFETAMINE 20 MG CAPSULE
20 | ORAL_CAPSULE | Freq: Every morning | ORAL | 1 refills | 30.00000 days | Status: AC
Start: 2022-12-20 — End: 2023-01-10

## 2023-01-04 ENCOUNTER — Encounter: Admit: 2023-01-04 | Payer: PRIVATE HEALTH INSURANCE | Attending: Family Medicine

## 2023-01-10 MED ORDER — DEXTROAMPHETAMINE-AMPHETAMINE ER 15 MG 24HR CAPSULE,EXTEND RELEASE
15 | ORAL_CAPSULE | Freq: Every morning | ORAL | 1 refills | 30.00000 days | Status: AC
Start: 2023-01-10 — End: ?

## 2023-02-15 ENCOUNTER — Encounter: Admit: 2023-02-15 | Payer: PRIVATE HEALTH INSURANCE | Attending: Family Medicine

## 2023-02-18 MED ORDER — DEXTROAMPHETAMINE-AMPHETAMINE ER 15 MG 24HR CAPSULE,EXTEND RELEASE
15 | ORAL_CAPSULE | Freq: Every morning | ORAL | 1 refills | 30.00000 days | Status: AC
Start: 2023-02-18 — End: ?

## 2023-03-14 ENCOUNTER — Encounter
Admit: 2023-03-14 | Payer: Managed Care (Private) | Attending: Student in an Organized Health Care Education/Training Program

## 2023-04-08 ENCOUNTER — Encounter: Admit: 2023-04-08 | Payer: PRIVATE HEALTH INSURANCE | Attending: Family Medicine

## 2023-04-09 ENCOUNTER — Encounter: Admit: 2023-04-09 | Payer: PRIVATE HEALTH INSURANCE | Attending: Family Medicine

## 2023-04-09 MED ORDER — DEXTROAMPHETAMINE-AMPHETAMINE 20 MG TABLET
20 | Freq: Every day | ORAL | 1.00 refills | 30.00000 days | Status: AC
Start: 2023-04-09 — End: 2023-04-09

## 2023-04-09 MED ORDER — DEXTROAMPHETAMINE-AMPHETAMINE 20 MG TABLET
20 | ORAL_TABLET | Freq: Every day | ORAL | 1 refills | 30.00000 days | Status: AC
Start: 2023-04-09 — End: ?

## 2023-05-09 ENCOUNTER — Encounter
Admit: 2023-05-09 | Payer: Managed Care (Private) | Attending: Student in an Organized Health Care Education/Training Program

## 2023-05-17 ENCOUNTER — Encounter: Admit: 2023-05-17 | Payer: PRIVATE HEALTH INSURANCE | Attending: Internal Medicine | Primary: Family Medicine

## 2023-05-20 IMAGING — MG MAMMOGRAPHY SCREENING BILATERAL 3[PERSON_NAME]
8 series · 8 of 24 positions shown · non-contrast
Comparison: No prior examinations were available for comparison.

________________________________________________________________________________________________ 
MAMMOGRAPHY SCREENING BILATERAL 3EURY RAUDA, 05/20/2023 [DATE]: 
CLINICAL INDICATION: Encounter for screening mammogram.
TECHNIQUE: Digital bilateral mammograms and 3-D Tomosynthesis were obtained. 
These were interpreted both primarily and with the aid of computer-aided 
detection system.  
BREAST DENSITY: (Level B) There are scattered areas of fibroglandular density.

[L CC]
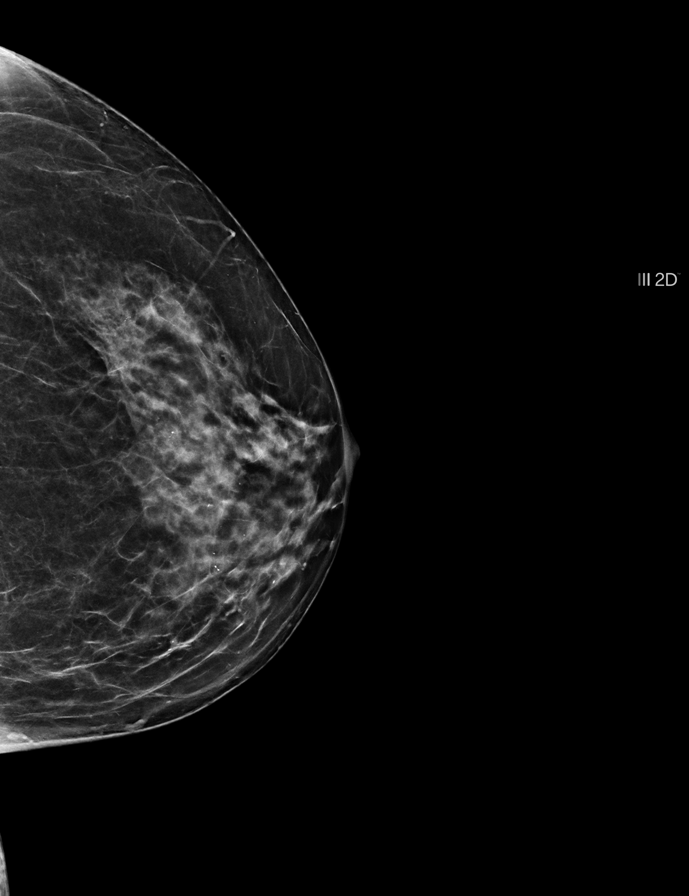

[L MLO]
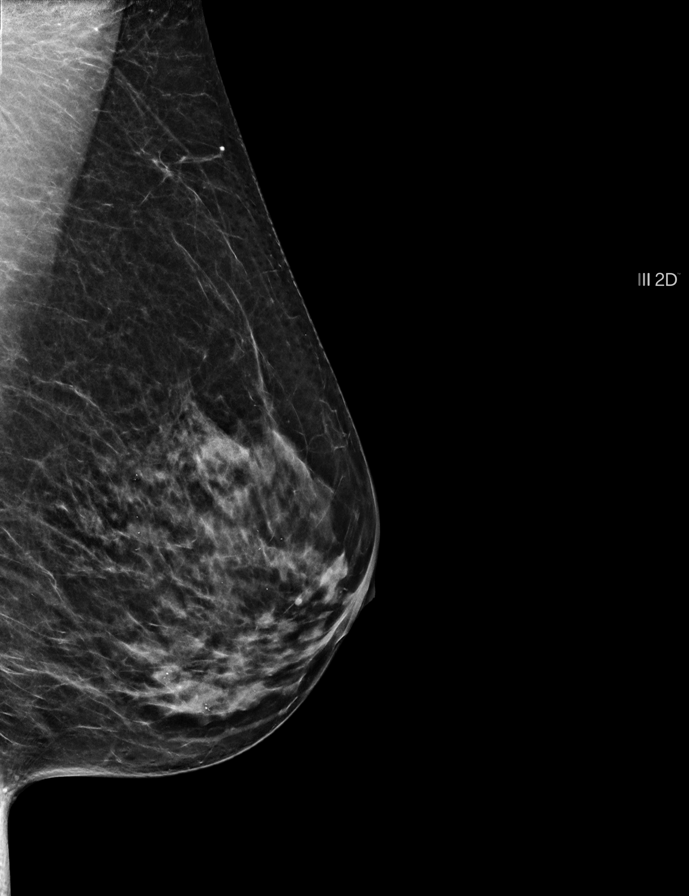

[R CC]
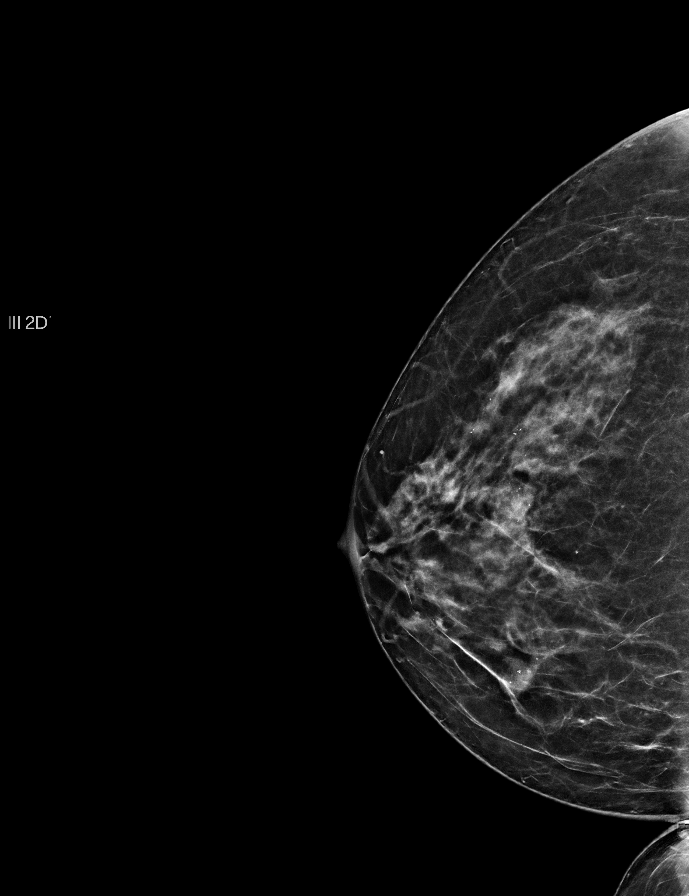

[R MLO]
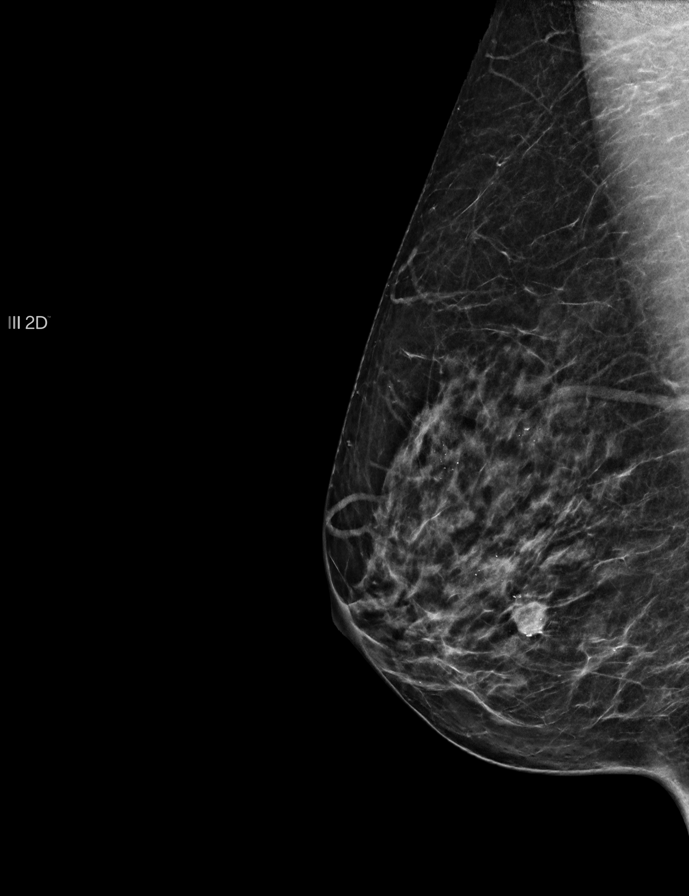

[R MLO tomo · tomo slice 27/53.0]
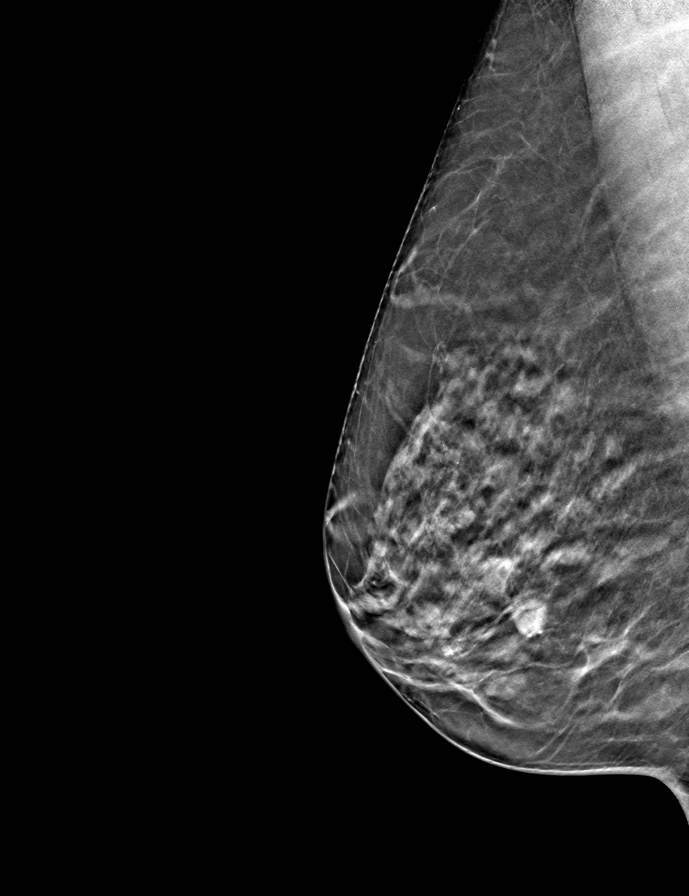

[L MLO tomo · tomo slice 27/53.0]
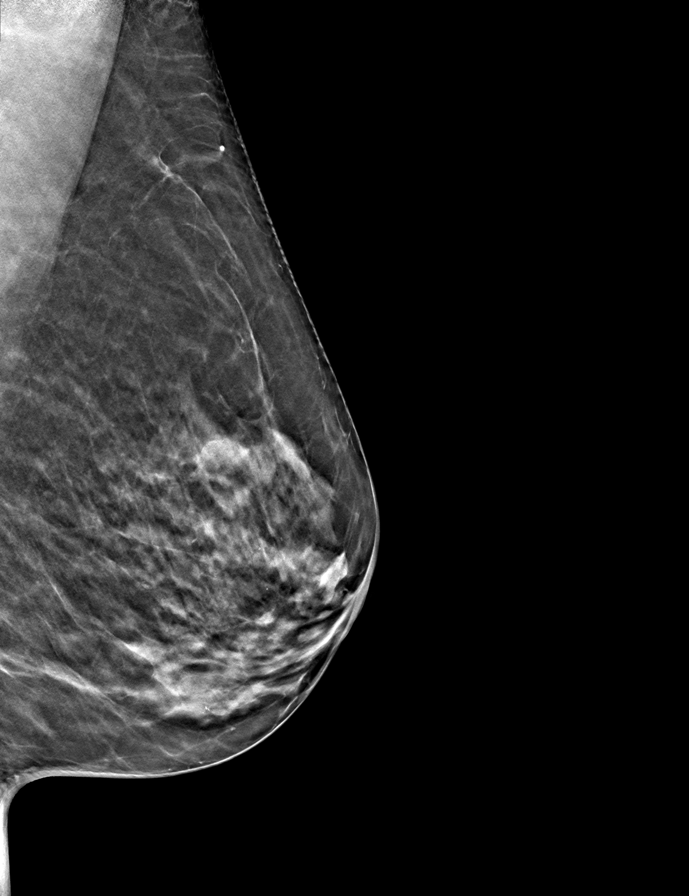

[R CC tomo · tomo slice 27/53.0]
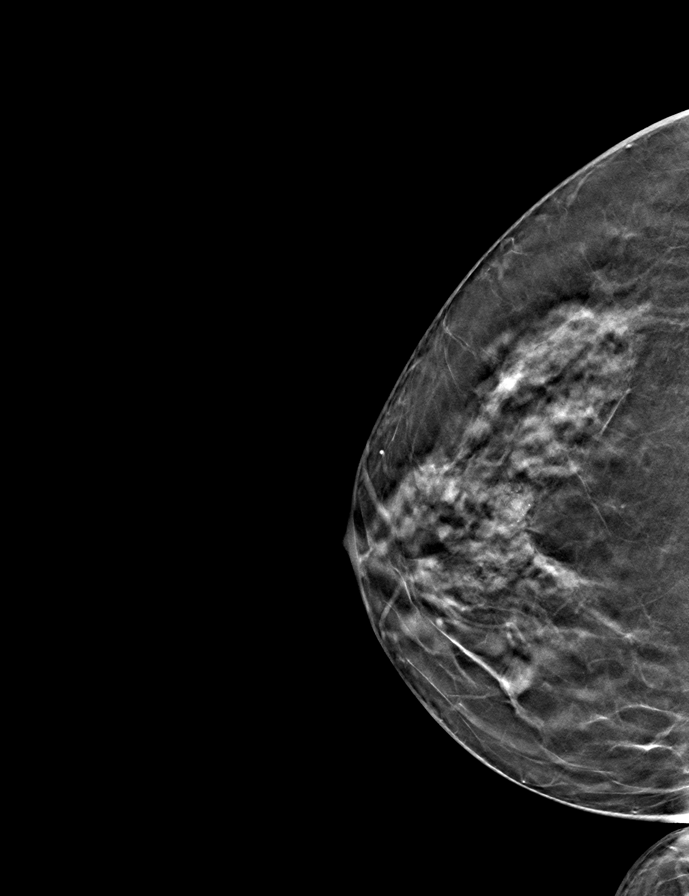

[L CC tomo · tomo slice 29/56.0]
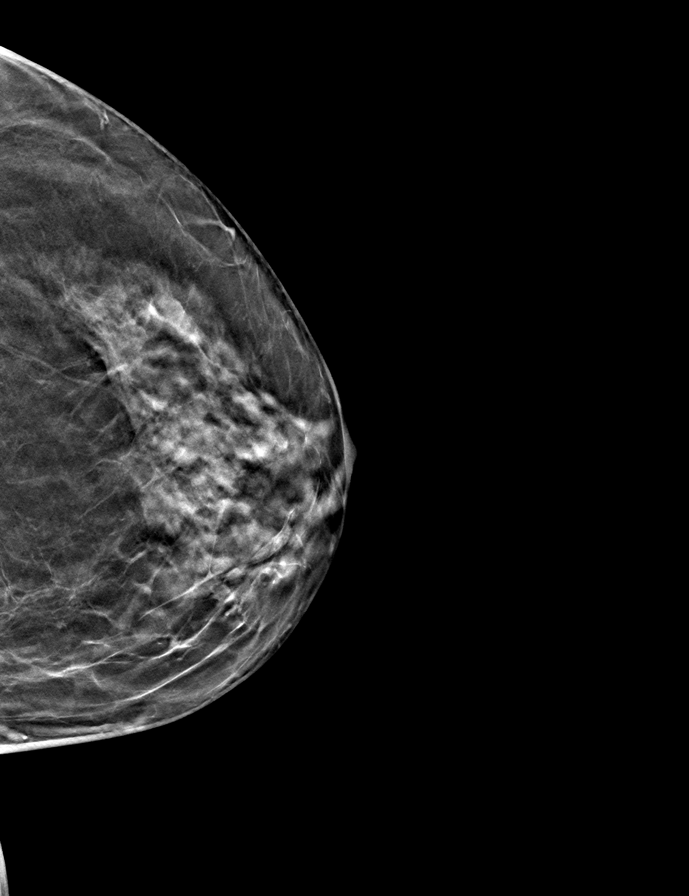

[8 of 24 positions shown; findings below may reference images not displayed]

Please note 
comparison with prior examinations increases the sensitivity for the detection 
of malignancy and decreases the false positive recall rate. If prior 
examinations are made available an addendum to this report will be generated.
FINDINGS: Nodular density in the lower inner quadrant of the right breast as 
well as groups of calcifications in the upper-outer quadrant of each breast 
better seen on the right than left. Well attempt again to acquire previous 
mammograms to assess stability of these findings. If mammograms cannot be 
located patient will then be brought back for compression and magnification 
views of both breasts and possible limited right breast ultrasound for further 
evaluation.
IMPRESSION: Nodular density in the lower inner quadrant of the right breast as well as 
groups of calcifications in the upper-outer quadrant of each breast better seen 
on the right than left. Well attempt again to acquire previous mammograms to 
assess stability of these findings. If mammograms cannot be located patient will 
then be brought back for compression and magnification views of both breasts and 
possible limited right breast ultrasound for further evaluation 
(BI-RADS 0) Incomplete. Further evaluation will be performed as discussed above 
and the results will be reported separately.

## 2023-06-10 IMAGING — MR MRI LUMBAR SPINE WITHOUT CONTRAST
6 of 8 series · 13 of 48 positions shown · IV contrast (gadolinium)
Comparison: None

________________________________________________________________________________________________ 
MRI LUMBAR SPINE WITHOUT CONTRAST, 06/10/2023 [DATE]: 
CLINICAL INDICATION: Lumbago With Sciatica, Right Side
TECHNIQUE: Multiplanar, multiecho position MR images of the lumbar spine were 
performed without intravenous gadolinium enhancement. Patient was scanned on a 
1.5T magnet

[Series 101: survey · axial · 10.0mm · 1.25mm/px · 1 of 10 slices shown]
[im 1/10]
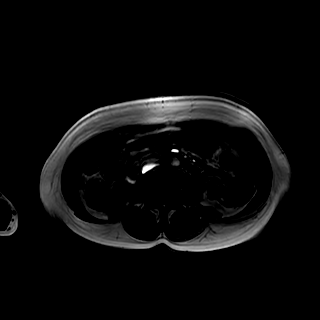

[Series 201: t2w_cor-surv · coronal · 6.0mm · 0.62mm/px · 1 of 10 slices shown]
[im 1/10]
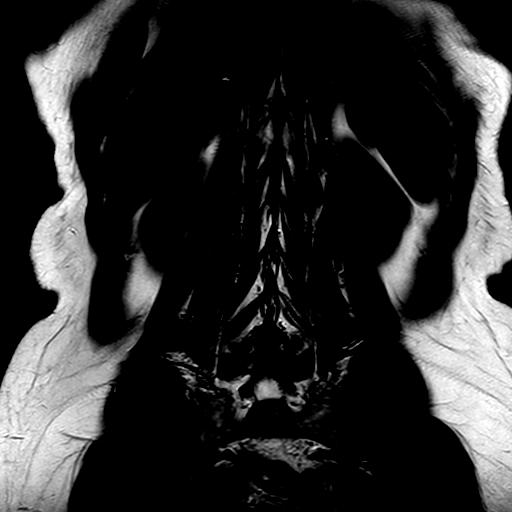

[Series 301: t1_tse_sag · sagittal · 4.0mm · 0.40mm/px · 3 of 19 slices shown]
[im 1/19]
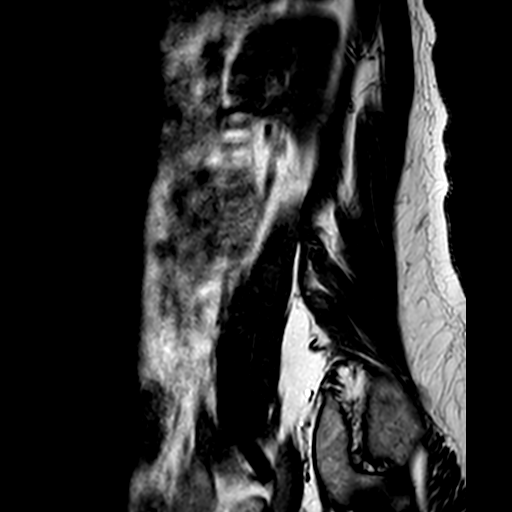
[im 10/19]
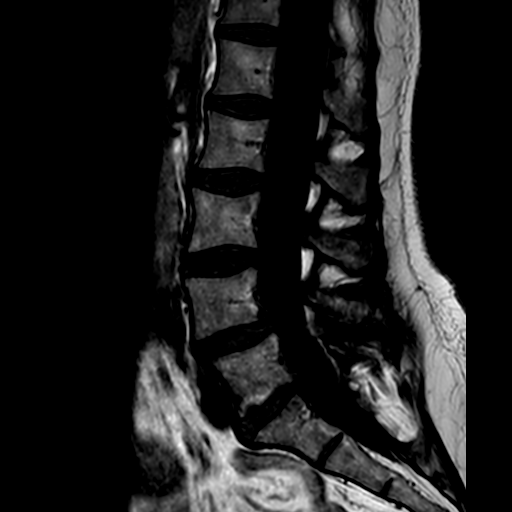
[im 19/19]
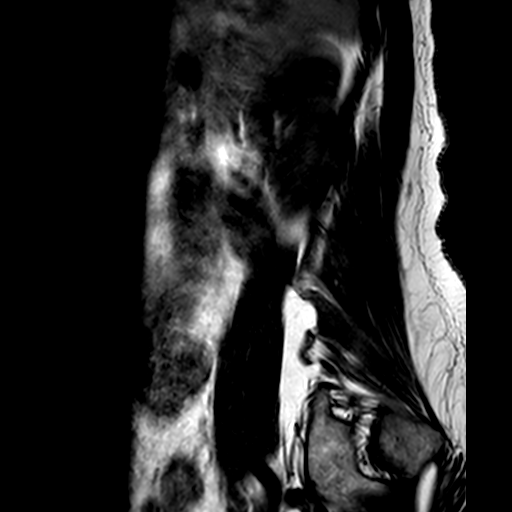

[Series 402: (id)_mdixon_tse · sagittal · 4.0mm · 0.52mm/px · 3 of 19 slices shown]
[im 1/19]
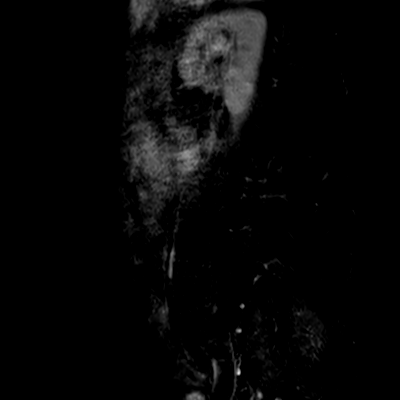
[im 10/19]
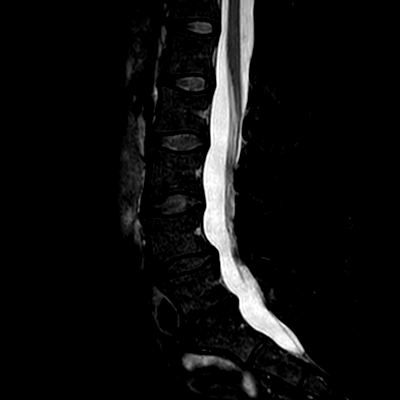
[im 19/19]
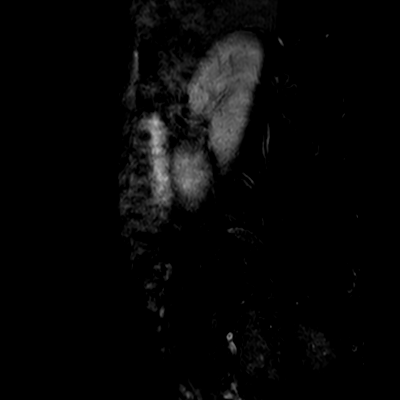

[Series 403: st2w_mdixon_tse · sagittal · 4.0mm · 0.52mm/px · 3 of 19 slices shown]
[im 1/19]
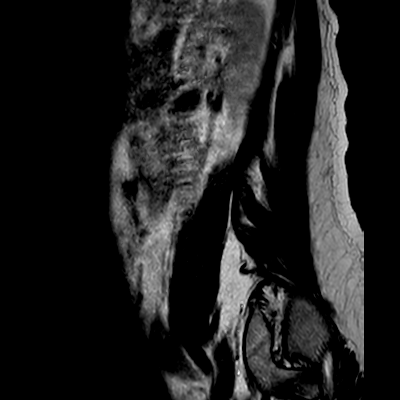
[im 10/19]
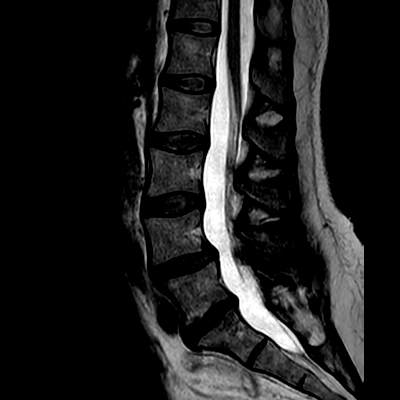
[im 19/19]
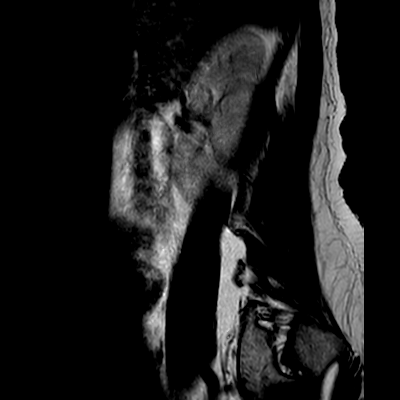

[Series 502: (id) view_ax mpr · axial · 1.0mm · 0.25mm/px · z∈[-108,-87]mm · 2 of 116 slices shown]
[im 8/116]
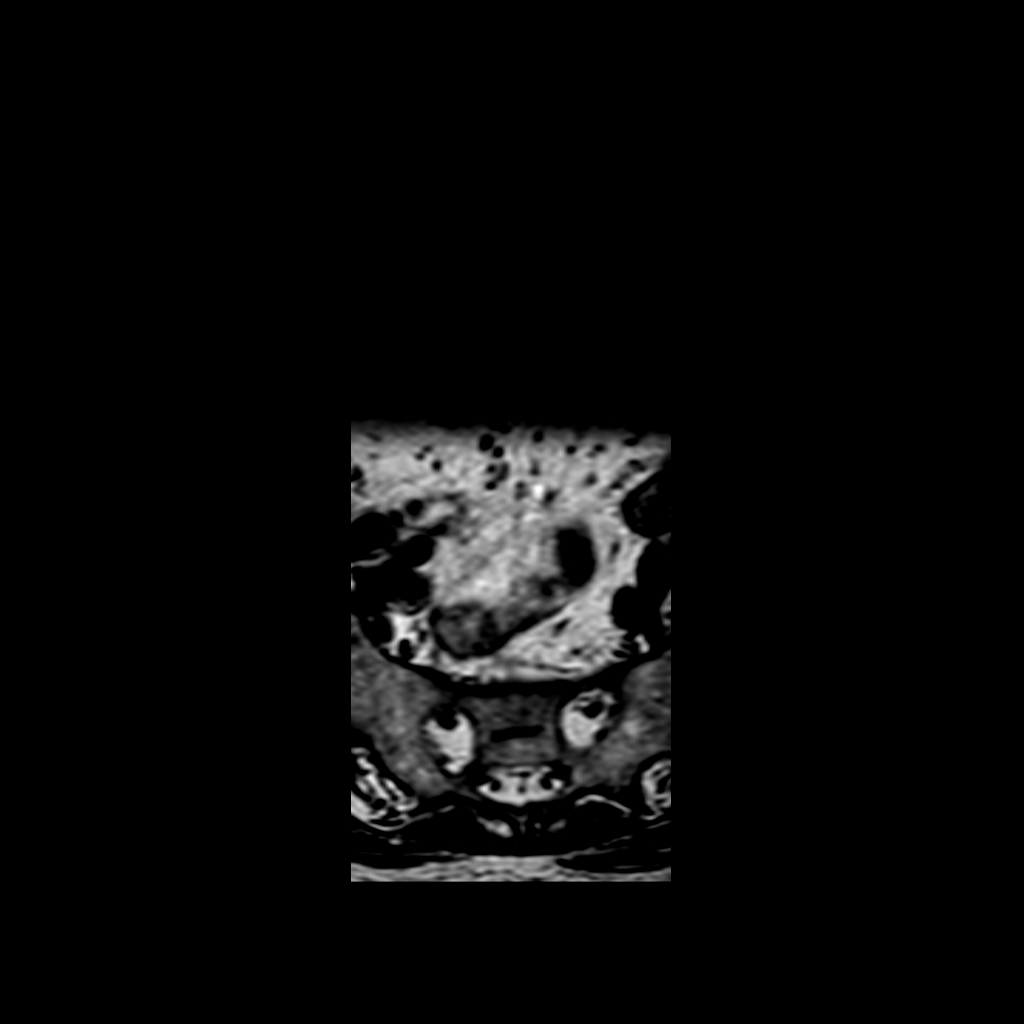
[im 22/116]
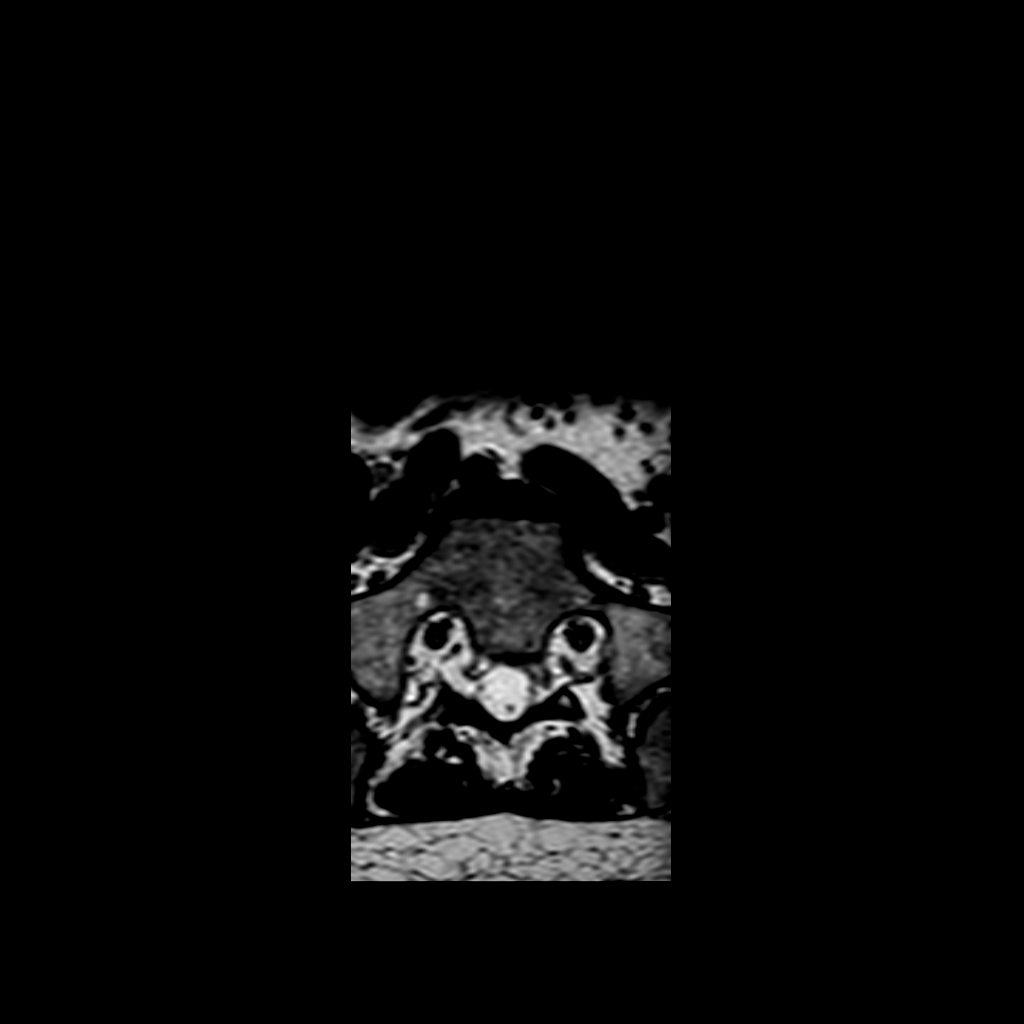

[13 of 48 positions shown; findings below may reference images not displayed]

FINDINGS: GENERAL: 
Nomenclature is based on 5 lumbar type vertebral bodies.     
ALIGNMENT: 0.3 cm anterolisthesis at L4-5. 
VERTEBRAE: No fracture. Tiny osteophytes. L5 inferior endplate Schmorl node. 
MARROW SIGNAL: No focal suspect signal abnormality. 
CORD SIGNAL: Normal distal spinal cord and cauda equina. Conus medullaris 
terminates at L2. 
EXTRASPINAL STRUCTURES: Unremarkable. 
Modic I-II: None. 
Ligamentum Flavum > 2.5 mm: All levels 
SEGMENTAL: 
T12-L1: Normal disc height and signal. No herniation. Normal facets. No spinal 
canal or neural foraminal stenosis. 
L1-L2: Normal disc height and signal. No herniation. Normal facets. No spinal 
canal or neural foraminal stenosis. 
L2-L3: Normal disc height with disc desiccation. No herniation. Normal facets. 
No spinal canal or neural foraminal stenosis. 
L3-L4: Small disc bulge and disc desiccation.  Normal disc height. No 
herniation. Normal facets. No spinal canal or neural foraminal stenosis. 
L4-L5: Disc bulge, mild disc space narrowing and disc desiccation. No 
herniation. Moderate right and mild left facet arthropathy. Ligamentum flavum 
hypertrophy. Mild central canal/lateral recess stenosis. Borderline 
neural foraminal stenosis. 
L5-S1: Disc bulge, moderate disc space narrowing and disc desiccation. No 
herniation. Mild facet arthropathy. No spinal canal or neural foraminal 
stenosis. 
IMPRESSION 
1.  Multifocal degenerative change and 0.3 cm anterolisthesis at L4-5. 
2.  L4-L5 mild central canal/lateral recess stenosis. 
3.  No disc protrusion.

## 2023-06-11 IMAGING — MG MAMMOGRAPHY DIAGNOSTIC BILATERAL 3[PERSON_NAME]
8 of 10 series · 8 of 18 positions shown · non-contrast
Comparison: 05/20/2023

________________________________________________________________________________________________ 
MAMMOGRAPHY DIAGNOSTIC BILATERAL 3RODINO FARAO, RIGHT BREAST ULTRASOUND UNILATERAL 
LIMITED, 06/11/2023 [DATE]: 
CLINICAL INDICATION: Callback from screening mammogram
TECHNIQUE: Mammogram: Digital bilateral mammograms and 3-D Tomosynthesis were 
obtained. These were interpreted both primarily and with the aid of 
computer-aided detection system. Ultrasound: Ultrasound performed of the right 
breast, limited. 
BREAST DENSITY: (Level B) There are scattered areas of fibroglandular density.

[L MLO]
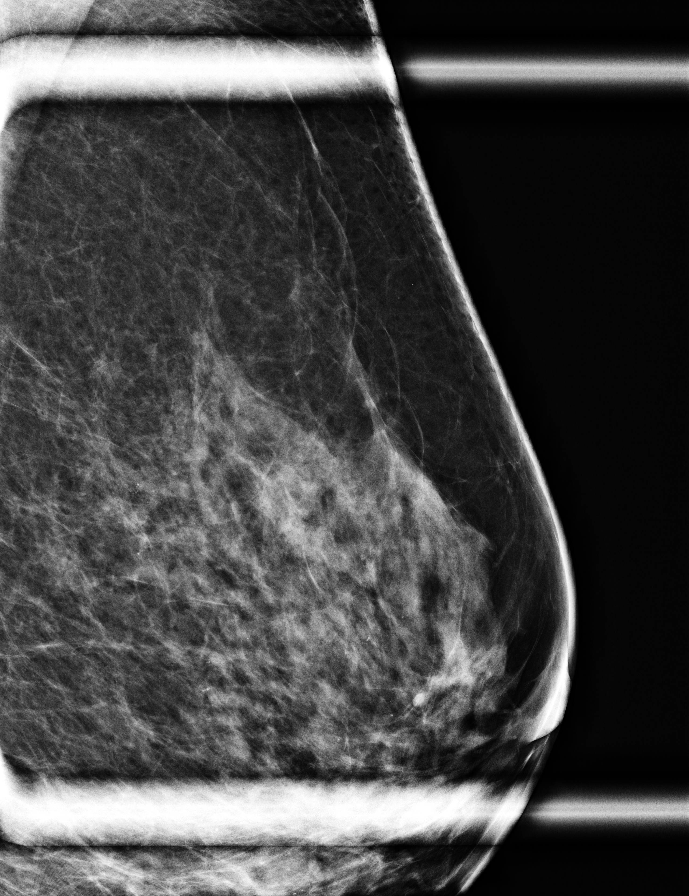

[R CC]
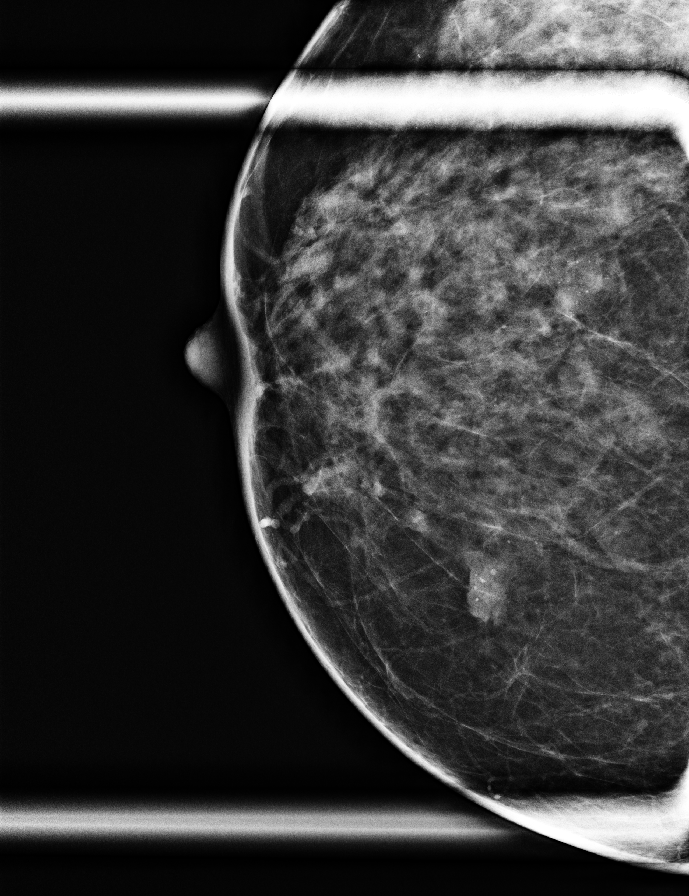

[R ML (1 of 2)]
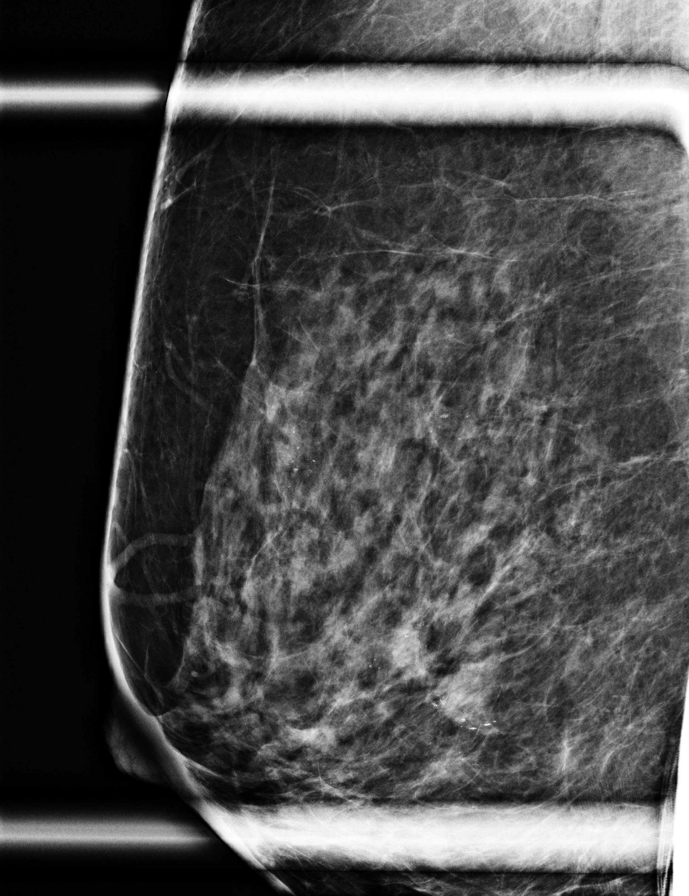

[L ML (1 of 2)]
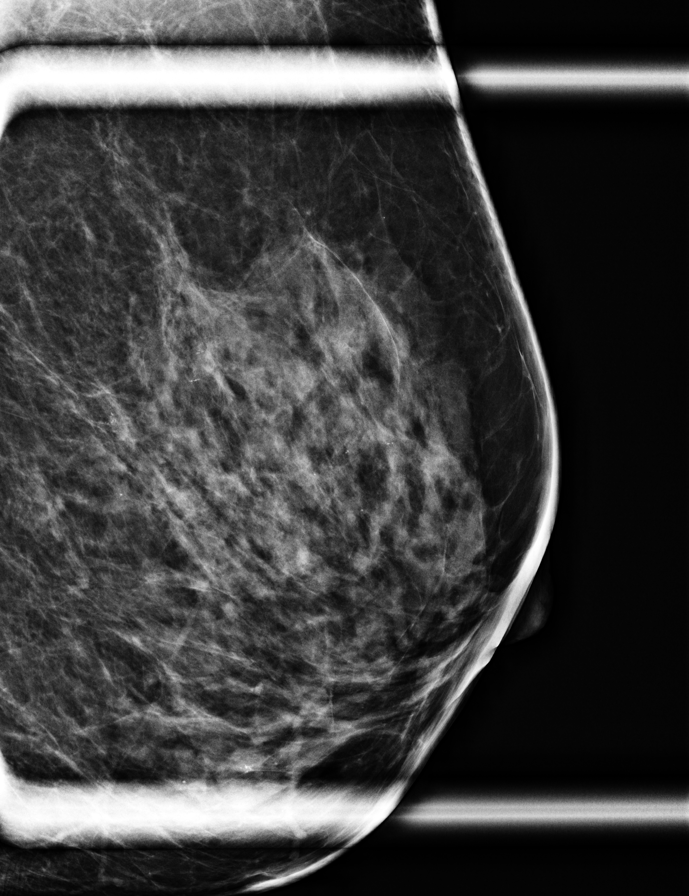

[R MLO]
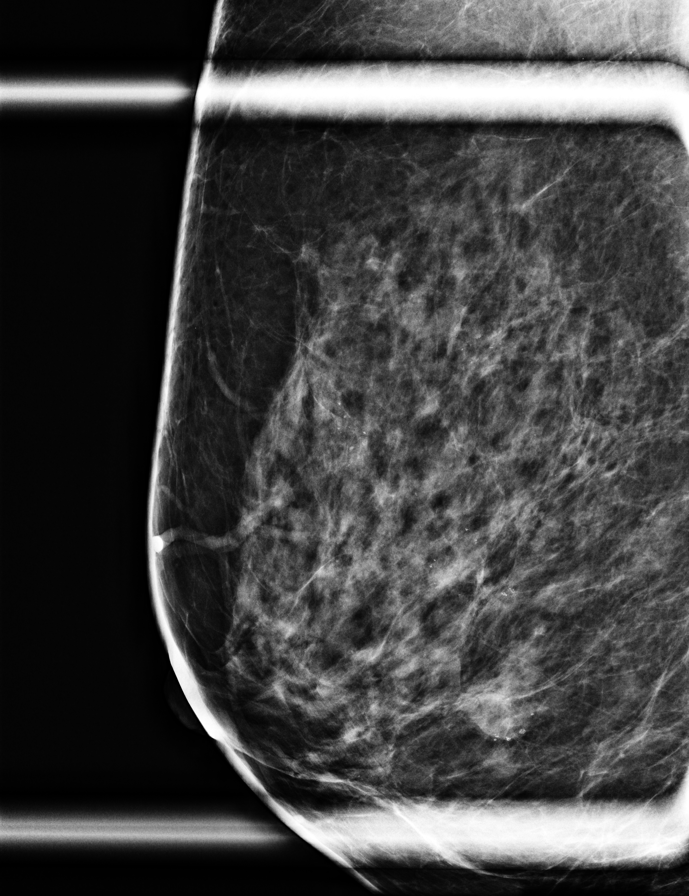

[L CC]
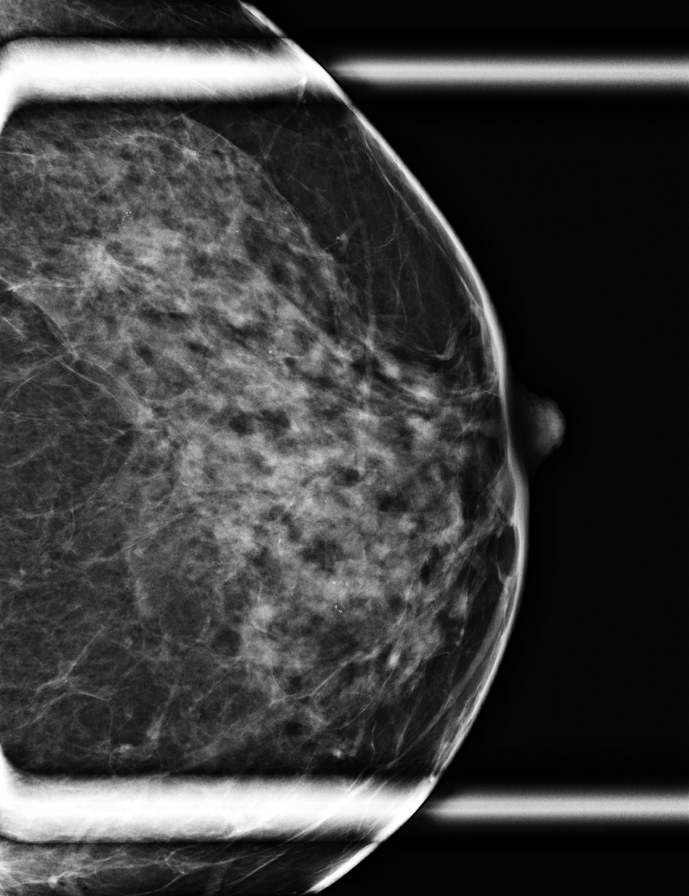

[R ML (2 of 2)]
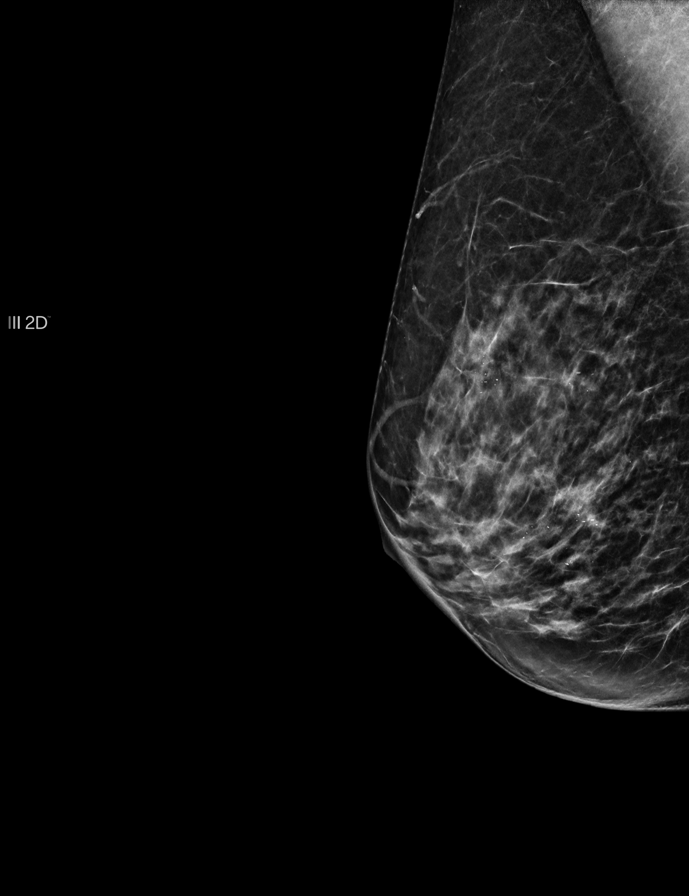

[L ML (2 of 2)]
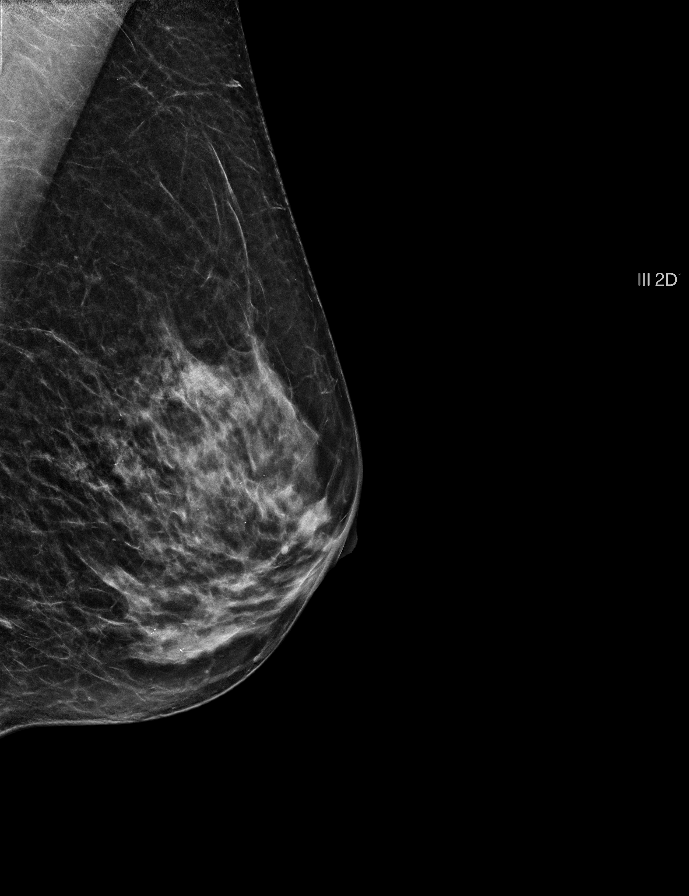

[8 of 18 positions shown; findings below may reference images not displayed]

FINDINGS: Mammogram: There is a persistent lesion with margins poorly defined within the 
medial slightly inferior right breast with a few associated microcalcifications. 
This is located approximately 4 cm from the nipple. There are a few punctate 
benign type calcifications within both breasts. 
Ultrasound: Ultrasound performed of the right breast. At the [DATE] position, 3 cm 
from the nipple is a cluster of cysts with a few calcifications. No solid mass. 
No lymphadenopathy. The sonographic findings correlate with the mammographic 
appearance.
IMPRESSION: No ultrasonographic or mammographic findings suggestive for malignancy. 
(BI-RADS 2) Benign findings. Routine mammographic follow-up is recommended.
